# Patient Record
Sex: Female | Born: 1970 | Race: White | Hispanic: No | Marital: Single | Smoking: Never smoker
Health system: Southern US, Community
[De-identification: ages and names within clinical notes are randomized; demographics above are authoritative.]

## PROBLEM LIST (undated history)

## (undated) DIAGNOSIS — G43909 Migraine, unspecified, not intractable, without status migrainosus: Secondary | ICD-10-CM

## (undated) DIAGNOSIS — F32A Depression, unspecified: Secondary | ICD-10-CM

## (undated) DIAGNOSIS — G8929 Other chronic pain: Secondary | ICD-10-CM

## (undated) DIAGNOSIS — M549 Dorsalgia, unspecified: Secondary | ICD-10-CM

## (undated) DIAGNOSIS — K089 Disorder of teeth and supporting structures, unspecified: Secondary | ICD-10-CM

## (undated) DIAGNOSIS — S3992XA Unspecified injury of lower back, initial encounter: Secondary | ICD-10-CM

## (undated) DIAGNOSIS — F329 Major depressive disorder, single episode, unspecified: Secondary | ICD-10-CM

## (undated) DIAGNOSIS — F419 Anxiety disorder, unspecified: Secondary | ICD-10-CM

## (undated) HISTORY — PX: CHOLECYSTECTOMY: SHX55

## (undated) HISTORY — PX: TUBAL LIGATION: SHX77

## (undated) HISTORY — PX: EYE SURGERY: SHX253

---

## 2001-01-02 ENCOUNTER — Emergency Department (HOSPITAL_COMMUNITY): Admission: EM | Admit: 2001-01-02 | Discharge: 2001-01-02 | Payer: Self-pay | Admitting: *Deleted

## 2001-01-04 ENCOUNTER — Inpatient Hospital Stay (HOSPITAL_COMMUNITY): Admission: EM | Admit: 2001-01-04 | Discharge: 2001-01-09 | Payer: Self-pay | Admitting: Psychiatry

## 2003-01-09 ENCOUNTER — Emergency Department (HOSPITAL_COMMUNITY): Admission: EM | Admit: 2003-01-09 | Discharge: 2003-01-09 | Payer: Self-pay | Admitting: Emergency Medicine

## 2003-01-29 ENCOUNTER — Ambulatory Visit (HOSPITAL_COMMUNITY): Admission: AD | Admit: 2003-01-29 | Discharge: 2003-01-29 | Payer: Self-pay | Admitting: Emergency Medicine

## 2003-02-01 ENCOUNTER — Emergency Department (HOSPITAL_COMMUNITY): Admission: EM | Admit: 2003-02-01 | Discharge: 2003-02-01 | Payer: Self-pay | Admitting: Emergency Medicine

## 2003-02-04 ENCOUNTER — Encounter: Payer: Self-pay | Admitting: *Deleted

## 2003-02-04 ENCOUNTER — Emergency Department (HOSPITAL_COMMUNITY): Admission: EM | Admit: 2003-02-04 | Discharge: 2003-02-04 | Payer: Self-pay | Admitting: Emergency Medicine

## 2003-07-14 ENCOUNTER — Emergency Department (HOSPITAL_COMMUNITY): Admission: EM | Admit: 2003-07-14 | Discharge: 2003-07-14 | Payer: Self-pay | Admitting: Emergency Medicine

## 2003-10-14 ENCOUNTER — Emergency Department (HOSPITAL_COMMUNITY): Admission: EM | Admit: 2003-10-14 | Discharge: 2003-10-14 | Payer: Self-pay | Admitting: Emergency Medicine

## 2004-03-06 ENCOUNTER — Emergency Department (HOSPITAL_COMMUNITY): Admission: EM | Admit: 2004-03-06 | Discharge: 2004-03-06 | Payer: Self-pay | Admitting: Emergency Medicine

## 2004-07-07 ENCOUNTER — Emergency Department (HOSPITAL_COMMUNITY): Admission: EM | Admit: 2004-07-07 | Discharge: 2004-07-07 | Payer: Self-pay | Admitting: Emergency Medicine

## 2004-07-20 ENCOUNTER — Emergency Department (HOSPITAL_COMMUNITY): Admission: EM | Admit: 2004-07-20 | Discharge: 2004-07-20 | Payer: Self-pay | Admitting: Emergency Medicine

## 2004-10-18 ENCOUNTER — Emergency Department (HOSPITAL_COMMUNITY): Admission: EM | Admit: 2004-10-18 | Discharge: 2004-10-18 | Payer: Self-pay | Admitting: Emergency Medicine

## 2004-12-11 ENCOUNTER — Emergency Department (HOSPITAL_COMMUNITY): Admission: EM | Admit: 2004-12-11 | Discharge: 2004-12-11 | Payer: Self-pay | Admitting: Emergency Medicine

## 2005-01-30 ENCOUNTER — Emergency Department (HOSPITAL_COMMUNITY): Admission: EM | Admit: 2005-01-30 | Discharge: 2005-01-30 | Payer: Self-pay | Admitting: *Deleted

## 2005-02-14 ENCOUNTER — Emergency Department (HOSPITAL_COMMUNITY): Admission: EM | Admit: 2005-02-14 | Discharge: 2005-02-14 | Payer: Self-pay | Admitting: Emergency Medicine

## 2005-02-21 ENCOUNTER — Emergency Department (HOSPITAL_COMMUNITY): Admission: EM | Admit: 2005-02-21 | Discharge: 2005-02-21 | Payer: Self-pay | Admitting: Emergency Medicine

## 2005-04-18 ENCOUNTER — Emergency Department (HOSPITAL_COMMUNITY): Admission: EM | Admit: 2005-04-18 | Discharge: 2005-04-18 | Payer: Self-pay | Admitting: *Deleted

## 2005-04-30 ENCOUNTER — Emergency Department (HOSPITAL_COMMUNITY): Admission: EM | Admit: 2005-04-30 | Discharge: 2005-04-30 | Payer: Self-pay | Admitting: Emergency Medicine

## 2005-05-25 ENCOUNTER — Emergency Department (HOSPITAL_COMMUNITY): Admission: EM | Admit: 2005-05-25 | Discharge: 2005-05-25 | Payer: Self-pay | Admitting: Emergency Medicine

## 2005-08-30 ENCOUNTER — Emergency Department (HOSPITAL_COMMUNITY): Admission: EM | Admit: 2005-08-30 | Discharge: 2005-08-30 | Payer: Self-pay | Admitting: Emergency Medicine

## 2005-12-19 ENCOUNTER — Emergency Department (HOSPITAL_COMMUNITY): Admission: EM | Admit: 2005-12-19 | Discharge: 2005-12-19 | Payer: Self-pay | Admitting: Emergency Medicine

## 2006-01-07 ENCOUNTER — Emergency Department (HOSPITAL_COMMUNITY): Admission: EM | Admit: 2006-01-07 | Discharge: 2006-01-07 | Payer: Self-pay | Admitting: Emergency Medicine

## 2006-09-01 ENCOUNTER — Emergency Department (HOSPITAL_COMMUNITY): Admission: EM | Admit: 2006-09-01 | Discharge: 2006-09-01 | Payer: Self-pay | Admitting: Emergency Medicine

## 2007-11-16 ENCOUNTER — Emergency Department (HOSPITAL_COMMUNITY): Admission: EM | Admit: 2007-11-16 | Discharge: 2007-11-16 | Payer: Self-pay | Admitting: Emergency Medicine

## 2007-11-17 ENCOUNTER — Emergency Department (HOSPITAL_COMMUNITY): Admission: EM | Admit: 2007-11-17 | Discharge: 2007-11-17 | Payer: Self-pay | Admitting: Emergency Medicine

## 2008-04-12 ENCOUNTER — Emergency Department (HOSPITAL_COMMUNITY): Admission: EM | Admit: 2008-04-12 | Discharge: 2008-04-13 | Payer: Self-pay | Admitting: Emergency Medicine

## 2008-04-15 ENCOUNTER — Observation Stay (HOSPITAL_COMMUNITY): Admission: EM | Admit: 2008-04-15 | Discharge: 2008-04-16 | Payer: Self-pay | Admitting: Emergency Medicine

## 2008-06-27 ENCOUNTER — Emergency Department (HOSPITAL_COMMUNITY): Admission: EM | Admit: 2008-06-27 | Discharge: 2008-06-27 | Payer: Self-pay | Admitting: Emergency Medicine

## 2008-07-17 ENCOUNTER — Emergency Department (HOSPITAL_COMMUNITY): Admission: EM | Admit: 2008-07-17 | Discharge: 2008-07-17 | Payer: Self-pay | Admitting: Emergency Medicine

## 2008-07-22 ENCOUNTER — Emergency Department (HOSPITAL_COMMUNITY): Admission: EM | Admit: 2008-07-22 | Discharge: 2008-07-22 | Payer: Self-pay | Admitting: Emergency Medicine

## 2008-09-30 ENCOUNTER — Emergency Department (HOSPITAL_COMMUNITY): Admission: EM | Admit: 2008-09-30 | Discharge: 2008-09-30 | Payer: Self-pay | Admitting: Emergency Medicine

## 2009-02-08 ENCOUNTER — Emergency Department (HOSPITAL_COMMUNITY): Admission: EM | Admit: 2009-02-08 | Discharge: 2009-02-08 | Payer: Self-pay | Admitting: Emergency Medicine

## 2009-05-11 ENCOUNTER — Emergency Department (HOSPITAL_COMMUNITY): Admission: EM | Admit: 2009-05-11 | Discharge: 2009-05-11 | Payer: Self-pay | Admitting: Emergency Medicine

## 2009-05-18 ENCOUNTER — Emergency Department (HOSPITAL_COMMUNITY): Admission: EM | Admit: 2009-05-18 | Discharge: 2009-05-18 | Payer: Self-pay | Admitting: Emergency Medicine

## 2009-08-12 ENCOUNTER — Emergency Department (HOSPITAL_COMMUNITY): Admission: EM | Admit: 2009-08-12 | Discharge: 2009-08-12 | Payer: Self-pay | Admitting: Emergency Medicine

## 2009-09-20 ENCOUNTER — Emergency Department (HOSPITAL_COMMUNITY): Admission: EM | Admit: 2009-09-20 | Discharge: 2009-09-20 | Payer: Self-pay | Admitting: Emergency Medicine

## 2009-11-09 ENCOUNTER — Emergency Department (HOSPITAL_COMMUNITY): Admission: EM | Admit: 2009-11-09 | Discharge: 2009-11-09 | Payer: Self-pay | Admitting: Emergency Medicine

## 2010-05-31 ENCOUNTER — Emergency Department (HOSPITAL_COMMUNITY): Admission: EM | Admit: 2010-05-31 | Discharge: 2010-05-31 | Payer: Self-pay | Admitting: Emergency Medicine

## 2010-07-02 ENCOUNTER — Emergency Department (HOSPITAL_COMMUNITY): Admission: EM | Admit: 2010-07-02 | Discharge: 2010-07-02 | Payer: Self-pay | Admitting: Emergency Medicine

## 2010-09-25 ENCOUNTER — Emergency Department (HOSPITAL_COMMUNITY)
Admission: EM | Admit: 2010-09-25 | Discharge: 2010-09-25 | Payer: Self-pay | Source: Home / Self Care | Admitting: Emergency Medicine

## 2010-10-13 ENCOUNTER — Emergency Department (HOSPITAL_COMMUNITY)
Admission: EM | Admit: 2010-10-13 | Discharge: 2010-10-13 | Disposition: A | Discharge status: Home / Self Care | Payer: Self-pay | Attending: Emergency Medicine | Admitting: Emergency Medicine

## 2010-10-13 DIAGNOSIS — G8929 Other chronic pain: Secondary | ICD-10-CM | POA: Insufficient documentation

## 2010-10-13 DIAGNOSIS — M545 Low back pain, unspecified: Secondary | ICD-10-CM | POA: Insufficient documentation

## 2011-01-01 ENCOUNTER — Emergency Department (HOSPITAL_COMMUNITY): Payer: Self-pay

## 2011-01-01 ENCOUNTER — Emergency Department (HOSPITAL_COMMUNITY)
Admission: EM | Admit: 2011-01-01 | Discharge: 2011-01-01 | Disposition: A | Discharge status: Home / Self Care | Payer: Self-pay | Attending: Emergency Medicine | Admitting: Emergency Medicine

## 2011-01-01 DIAGNOSIS — S40019A Contusion of unspecified shoulder, initial encounter: Secondary | ICD-10-CM | POA: Insufficient documentation

## 2011-01-01 DIAGNOSIS — W19XXXA Unspecified fall, initial encounter: Secondary | ICD-10-CM | POA: Insufficient documentation

## 2011-01-01 DIAGNOSIS — S7000XA Contusion of unspecified hip, initial encounter: Secondary | ICD-10-CM | POA: Insufficient documentation

## 2011-01-01 DIAGNOSIS — Z79899 Other long term (current) drug therapy: Secondary | ICD-10-CM | POA: Insufficient documentation

## 2011-01-01 DIAGNOSIS — M25559 Pain in unspecified hip: Secondary | ICD-10-CM | POA: Insufficient documentation

## 2011-01-01 DIAGNOSIS — M25519 Pain in unspecified shoulder: Secondary | ICD-10-CM | POA: Insufficient documentation

## 2011-01-24 NOTE — Discharge Summary (Signed)
Pamela May, Pamela May                ACCOUNT NO.:  [id number]   MEDICAL RECORD NO.:  [id number]          PATIENT TYPE:  OBV   LOCATION:  A325                          FACILITY:  [facility]   PHYSICIAN:  Skeet Latch, DO    DATE OF BIRTH:  [birth year]   DATE OF ADMISSION:  04/15/2008  DATE OF DISCHARGE:  08/06/2009LH                               DISCHARGE SUMMARY   DISCHARGE DIAGNOSES:  1. Drug overdose, questionable intentional.  2. History of bipolar disorder.  3. History of depression.  4. History of migraines.   BRIEF HOSPITAL COURSE:  This is a 40 year old female who presented to  the emergency room with possible drug overdose.  When she came to the  emergency room, she had altered mental status, unable to get accurate  history.  Apparently, the patient was seen at the emergency room several  days prior for a kick by a horse and was treated with pain medications.  On the day of admission, an anonymous phone call was made that said she  may have overdosed, was unsure if it was intentional or unintentional.  She was brought by EMS.  On initial exam, the patient cannot explain  exactly how many pills she took, but her drug screen was positive for  benzodiazepines and opiates.  The patient was seen by the ACT team in  the emergency room, did contract for safety.  ER doctor was planning to  discharge the patient, but the patient was still somnolent.  Today, she  is more responsive.  ACT team again saw the patient, felt that the  patient has no current suicidal ideation and no suicide plan.  At this  time, they felt the patient could be discharged since she has signed a  contract for safety.  I was called to the floor.  The patient was  threatening to leave AMA, so at this time we will let the patient be  discharged.   MEDICATIONS ON DISCHARGE:  1. Seroquel 100 mg at bedtime.  2. Lexapro 10 mg daily.  3. Hydrocodone/acetaminophen 5/500 every 6 hours as needed.   VITALS ON DISCHARGE:   Temperature is 97.9, pulse 79, respirations 20,  and blood pressure 94/59.  She is satting 98% on room air.   LABORATORIES:  Sodium 140, potassium 3.4, chloride 111, CO2 of 27,  glucose 85, BUN 5, creatinine 0.74, white count 3.1, hemoglobin 11.4,  hematocrit 33.1, platelet count is 129.  As stated, her salicylate level  is less than 4, alcohol level is less than 5, and acetaminophen level  was less than 10.   CONDITION ON DISCHARGE:  Stable.   DISPOSITION:  The patient will be discharged to home.   DISCHARGE INSTRUCTIONS:  The patient is to maintain a regular diet.  She  is to increase her activity to previous.  The patient again denies any  intentional drug overdose at this time.  The patient has agreed to  [facility] and has spoken with the ACT team regarding  this.      Skeet Latch, DO  Electronically Signed  SM/MEDQ  D:  04/16/2008  T:  04/17/2008  Job:  [id number]

## 2011-01-24 NOTE — H&P (Signed)
Pamela May, Pamela May NO.:  [id number]   MEDICAL RECORD NO.:  [id number]          PATIENT TYPE:  OBV   LOCATION:  A325                          FACILITY:  [facility]   PHYSICIAN:  Dorris Singh, DO    DATE OF BIRTH:  [birth year]   DATE OF ADMISSION:  04/15/2008  DATE OF DISCHARGE:  LH                              HISTORY & PHYSICAL   The patient is a 40 year old Caucasian female who presented to [facility] emergency room with possible overdose.  A level of caveat applied  to her because of altered mental status.  Apparently, she has had a  history of being seen in the ED several days before for a kick by a  horse in which she was treated with pain medication, and today  apparently an anonymous phone call was made that the patient may have  overdosed, not sure if it was intentional or unintentional.  She was  then brought here via EMS.  This call apparently was made by her mother.  While she was here, she continued to improve, but could not give an  exact date of how many pills that she did take.  Her drug screen was  positive for benzodiazepines and opiates.  The ACT team did see her  while she was in the emergency room.  She did contract for safety.  Plan  of the ER doctor was to discharge her to home because she was improving.  Prior to discharge, she became more somnolent and more unresponsive.  However, she was able to protect her airway.  It was determined that she  should be at least observed for 24 hours.  We went ahead and admitted  the patient to service of [facility].  Also there is some concern from  the mother that the patient may have had a breakup with her boyfriend,  and this may have prompted the use of these medications.  She is  hypokalemic.   PAST MEDICAL HISTORY:  Obtained from her chart.  She is bipolar,  depression and migraine headache.   SOCIAL HISTORY:  Unknown, unable to obtain.   PAST SURGICAL HISTORY:  Bilateral tubal ligation.   MEDICATIONS:  1. Seroquel 100 mg.  2. Lexapro 10 mg.  3. Xanax 1 mg.  4. Hydrocodone/acetaminophen 5/500.   REVIEW OF SYSTEMS:  A caveat of 5, unable to assess.   PHYSICAL EXAMINATION:  VITALS:  Temperature 97.8, pulse 78, respirations  20, blood pressure 112/67.  GENERAL:  The patient is a 40 year old Caucasian female who is  unresponsive to questions and when she does speak they do not make  sense.  HEENT:  Her head is normocephalic, atraumatic.  Eyes; pupils are  dilated.  NECK:  Supple.  No lymphadenopathy noted.  HEART:  Regular rate and rhythm.  LUNGS: Clear to auscultation bilaterally.  ABDOMEN: Soft, nontender, nondistended.  EXTREMITIES:  Positive pulses.  No edema, ecchymosis or cyanosis.  On  her left upper armpit she does have an abscess which is tender to the  touch.  Unable to assess if the patient wants anything done  prior to  admission to the service of [facility].  The patient told the nurse that  she did want anything done with this at that point in time, but we will  reassess this when the patient is more alert.   LABORATORY DATA:  Today, white count of 3.5, hemoglobin 11.7, hematocrit  33.7, platelet count 147.  Sodium is 136, potassium 3.2, chloride 106,  CO2 of 26, glucose 89, BUN 70, creatinine 0.71.  There was no other  testing that was done.   ASSESSMENT/PLAN:  Admits this is an unintentional overdose and history  of contusion to leg after injury from horse 2 days prior.   PLAN:  Admit to the service of [facility].  Will go ahead and administer  fluids.  I gave the patient Narcan, which did not wake her up.  Will put  her on DVT and GI prophylaxis.  As soon as she is more, we will have her  increase her diet as tolerated.  Will also request to have the ACT team  see here for a second time that she has dove back into being  unresponsive.  Also will give her antiemetics and Tylenol.  Will put her  on O2 as well and check her labs in the morning.  We will  also replace  her potassium.  Will continue to monitor her and hope we can discharge  her within the next 24-48 hours.      Dorris Singh, DO  Electronically Signed     CB/MEDQ  D:  04/16/2008  T:  04/16/2008  Job:  [id number]

## 2011-01-27 NOTE — Discharge Summary (Signed)
[facility]  Patient:    Pamela May, Pamela May                         MRN: [id number] Adm. Date:  [id number] Disc. Date: [id number] Attending:  Veneta Penton                           Discharge Summary  REASON FOR ADMISSION:  This 40 year old single white female was admitted for increasing symptoms of depression and suicidal ideation with a plan to kill herself by drug overdose.  For further history of present illness, please see the patients psychiatric admission assessment.  PHYSICAL EXAMINATION:  The patients physical examination at the time of admission was entirely unremarkable.  LABORATORY EXAMINATION:  The patient underwent a laboratory work-up to rule out any medical problems contributing to her symptomatology.  A comprehensive metabolic panel was within normal limits.  Hepatic panel was within normal limits. CBC was within normal limits. Thyroid function tests were within normal limits.  UA showed small leukocyte esterase, dipstick and urine microscopic evaluation was unremarkable.  The patient complained of no urinary tract symptoms.  The patient received no x-rays, no special procedures, no additional consultations. The patient sustained no complications during the course of this hospitalization.  HOSPITAL COURSE:  The patient rapidly adapted to unit routine, socializing well with both patients and staff. She was increased on Zoloft 150 mg p.o. q.d. as her admission dose of 100 mg was ineffectual. She complained of some psychomotor agitation and was placed on Seroquel at 25 mg p.o. b.i.d. which greatly improved her anxiety as well as her agitation.  She has also continued on Ambien 10 mg p.o. q.h.s. on a p.r.n. basis for sleep. She has been active and participating in all aspects of the therapeutic treatment program.  Her affect and mood have improved as have her activities of daily living, and at the time of discharge, she has established a  treatment plan for herself to address her psychosocial stressors on discharge.  At the time of discharge she denies any homicidal or suicidal ideations, her affect and mood have improved, she is performing all her activities of daily living.  Consequently, it is felt she has reached her maximum benefits of hospitalization and is ready for discharge to a less restrictive alternative setting.  She is motivated for outpatient therapy.  CONDITION ON DISCHARGE:  Improved.  DIAGNOSIS ACCORDING TO DSM-4: AXIS I.   1. Major depression, recurrent type, severe without psychosis.           2. Rule out post traumatic stress disorder. AXIS II.  Rule out personality disorder, not otherwise specified. AXIS III. None. AXIS IV.  Current psychosocial stressors are extreme with problems with           primary support group, social environment, educational problems,           occupational problems, housing problems, economic problems and           problems with access to heathcare as well as problems related to           the legal system. AXIS V.   Code 10 go 20 on admission, code 30 on discharge, highest level of           functioning in the past code 60.  FURTHER EVALUATION AND TREATMENT RECOMMENDATIONS: 1. The patient is discharged to home. 2. She will follow up at  [facility] for all    further aspects of her psychiatric care and consequently I will sign off    on the case at this time. 3. She is discharged on an unrestricted level of activity and a regular diet. 4. She is discharged on Seroquel 25 mg p.o. b.i.d., Zoloft 150 mg p.o. q.d.,    Ambien 10 mg p.o. q.h.s. p.r.n. sleep. DD:  01/09/01 TD:  01/10/01 Job: [id number]
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
## 2011-02-06 ENCOUNTER — Emergency Department (HOSPITAL_COMMUNITY)
Admission: EM | Admit: 2011-02-06 | Discharge: 2011-02-06 | Disposition: A | Discharge status: Home / Self Care | Payer: Self-pay | Attending: Emergency Medicine | Admitting: Emergency Medicine

## 2011-02-06 ENCOUNTER — Emergency Department (HOSPITAL_COMMUNITY): Payer: Self-pay

## 2011-02-06 DIAGNOSIS — R0989 Other specified symptoms and signs involving the circulatory and respiratory systems: Secondary | ICD-10-CM | POA: Insufficient documentation

## 2011-02-06 DIAGNOSIS — R21 Rash and other nonspecific skin eruption: Secondary | ICD-10-CM | POA: Insufficient documentation

## 2011-02-06 DIAGNOSIS — F341 Dysthymic disorder: Secondary | ICD-10-CM | POA: Insufficient documentation

## 2011-02-06 DIAGNOSIS — R0609 Other forms of dyspnea: Secondary | ICD-10-CM | POA: Insufficient documentation

## 2011-02-06 DIAGNOSIS — R0602 Shortness of breath: Secondary | ICD-10-CM | POA: Insufficient documentation

## 2011-02-06 DIAGNOSIS — I10 Essential (primary) hypertension: Secondary | ICD-10-CM | POA: Insufficient documentation

## 2011-02-06 DIAGNOSIS — T7840XA Allergy, unspecified, initial encounter: Secondary | ICD-10-CM | POA: Insufficient documentation

## 2011-06-05 LAB — POCT CARDIAC MARKERS
Myoglobin, poc: 39.7
Troponin i, poc: <0.05

## 2011-06-05 LAB — CBC
HCT: 39.9
MCV: 88.5
RBC: 4.51
WBC: 7.4

## 2011-06-05 LAB — URINALYSIS, ROUTINE W REFLEX MICROSCOPIC
Glucose, UA: NEGATIVE
Ketones, ur: NEGATIVE
Nitrite: POSITIVE — AB
Protein, ur: NEGATIVE
Urobilinogen, UA: 0.2
pH: 6

## 2011-06-05 LAB — DIFFERENTIAL
Eosinophils Absolute: 0
Eosinophils Relative: 0
Lymphocytes Relative: 25
Lymphs Abs: 1.8
Monocytes Relative: 5
Neutrophils Relative %: 70

## 2011-06-05 LAB — BASIC METABOLIC PANEL
Chloride: 104
GFR calc Af Amer: >60
Potassium: 3.7

## 2011-06-05 LAB — URINE MICROSCOPIC-ADD ON

## 2011-06-09 LAB — BASIC METABOLIC PANEL
Calcium: 7.8 — ABNORMAL LOW
Creatinine, Ser: 0.74
GFR calc non Af Amer: >60
Glucose, Bld: 85
Sodium: 140

## 2011-06-09 LAB — DIFFERENTIAL
Basophils Absolute: 0
Basophils Absolute: 0.1
Basophils Relative: 2 — ABNORMAL HIGH
Basophils Relative: 2 — ABNORMAL HIGH
Eosinophils Absolute: 0
Lymphocytes Relative: 36
Monocytes Absolute: 0.4
Monocytes Absolute: 0.4
Neutro Abs: 1.4 — ABNORMAL LOW
Neutro Abs: 2.1
Neutrophils Relative %: 47
Neutrophils Relative %: 60

## 2011-06-09 LAB — URINE MICROSCOPIC-ADD ON

## 2011-06-09 LAB — URINALYSIS, ROUTINE W REFLEX MICROSCOPIC
Glucose, UA: NEGATIVE
Ketones, ur: NEGATIVE
Leukocytes, UA: NEGATIVE
pH: 6

## 2011-06-09 LAB — CBC
HCT: 33.7 — ABNORMAL LOW
Hemoglobin: 11.4 — ABNORMAL LOW
Hemoglobin: 11.7 — ABNORMAL LOW
Platelets: 129 — ABNORMAL LOW
RDW: 13.2
WBC: 3.5 — ABNORMAL LOW

## 2011-06-09 LAB — COMPREHENSIVE METABOLIC PANEL
Albumin: 3.6
Alkaline Phosphatase: 76
BUN: 7
Chloride: 106
Glucose, Bld: 89
Potassium: 3.2 — ABNORMAL LOW
Total Bilirubin: 0.6

## 2011-06-09 LAB — ETHANOL: Alcohol, Ethyl (B): <5

## 2011-06-09 LAB — RAPID URINE DRUG SCREEN, HOSP PERFORMED: Barbiturates: NOT DETECTED

## 2011-09-27 ENCOUNTER — Emergency Department (HOSPITAL_COMMUNITY)
Admission: EM | Admit: 2011-09-27 | Discharge: 2011-09-27 | Disposition: A | Discharge status: Home / Self Care | Payer: Self-pay | Attending: Emergency Medicine | Admitting: Emergency Medicine

## 2011-09-27 ENCOUNTER — Encounter (HOSPITAL_COMMUNITY): Payer: Self-pay

## 2011-09-27 DIAGNOSIS — R6884 Jaw pain: Secondary | ICD-10-CM | POA: Insufficient documentation

## 2011-09-27 DIAGNOSIS — K089 Disorder of teeth and supporting structures, unspecified: Secondary | ICD-10-CM | POA: Insufficient documentation

## 2011-09-27 DIAGNOSIS — R220 Localized swelling, mass and lump, head: Secondary | ICD-10-CM | POA: Insufficient documentation

## 2011-09-27 DIAGNOSIS — K047 Periapical abscess without sinus: Secondary | ICD-10-CM | POA: Insufficient documentation

## 2011-09-27 MED ORDER — AMOXICILLIN 250 MG PO CAPS
500.0000 mg | ORAL_CAPSULE | Freq: Once | ORAL | Status: AC
  Administered 2011-09-27: 500 mg via ORAL
  Filled 2011-09-27: qty 2

## 2011-09-27 MED ORDER — HYDROCODONE-ACETAMINOPHEN 5-325 MG PO TABS: 1.0000 | ORAL_TABLET | Freq: Once | ORAL | Status: AC

## 2011-09-27 MED ORDER — AMOXICILLIN 500 MG PO CAPS: 500.0000 mg | ORAL_CAPSULE | Freq: Three times a day (TID) | ORAL | Status: AC

## 2011-09-27 MED ORDER — HYDROCODONE-ACETAMINOPHEN 5-325 MG PO TABS
1.0000 | ORAL_TABLET | Freq: Once | ORAL | Status: AC
  Administered 2011-09-27: 1 via ORAL
  Filled 2011-09-27: qty 1

## 2011-09-27 NOTE — ED Notes (Signed)
Pt has toothache, pt has a multiple dental caries and missing teeth. Noted swelling in left side of face from jaw line to upper eye lid. Pt denies n/v and fever.

## 2011-09-27 NOTE — ED Notes (Signed)
Several teeth broken off to the gums, having dental pain per pt. Also woke up this morning with swelling under left eye per pt.

## 2011-09-27 NOTE — ED Provider Notes (Signed)
Medical screening examination/treatment/procedure(s) were performed by non-physician practitioner and as supervising physician I was immediately available for consultation/collaboration.   Glynn Octave, MD 09/27/11 2121

## 2011-09-27 NOTE — ED Provider Notes (Signed)
History     CSN: [id number]  Arrival date & time 09/27/11  4098   First MD Initiated Contact with Patient 09/27/11 1933      Chief Complaint  Patient presents with  . Dental Pain    (Consider location/radiation/quality/duration/timing/severity/associated sxs/prior treatment) Patient is a 41 y.o. female presenting with tooth pain. The history is provided by the patient.  Dental PainThe primary symptoms include mouth pain. Primary symptoms do not include headaches, fever, shortness of breath or sore throat. The symptoms began 12 to 24 hours ago. The symptoms are worsening. The symptoms occur constantly.  Additional symptoms include: dental sensitivity to temperature, gum swelling, gum tenderness, jaw pain and facial swelling. Additional symptoms do not include: trouble swallowing, ear pain and swollen glands.    History reviewed. No pertinent past medical history.  Past Surgical History  Procedure Date  . Tubal ligation   . Eye surgery     No family history on file.  History  Substance Use Topics  . Smoking status: Never Smoker   . Smokeless tobacco: Not on file  . Alcohol Use: No    OB History    Grav Para Term Preterm Abortions TAB SAB Ect Mult Living                  Review of Systems  Constitutional: Negative for fever.  HENT: Positive for facial swelling and dental problem. Negative for ear pain, congestion, sore throat, trouble swallowing and neck pain.   Eyes: Negative.   Respiratory: Negative for chest tightness and shortness of breath.   Cardiovascular: Negative for chest pain.  Gastrointestinal: Negative for nausea and abdominal pain.  Genitourinary: Negative.   Musculoskeletal: Negative for joint swelling and arthralgias.  Skin: Negative.  Negative for rash and wound.  Neurological: Negative for dizziness, weakness, light-headedness, numbness and headaches.  Hematological: Negative.   Psychiatric/Behavioral: Negative.     Allergies  Sulfa  antibiotics and Toradol  Home Medications   Current Outpatient Rx  Name Route Sig Dispense Refill  . ALPRAZOLAM 1 MG PO TABS Oral Take 1 mg by mouth 3 (three) times daily as needed. For anxiety    . CITALOPRAM HYDROBROMIDE 20 MG PO TABS Oral Take 20 mg by mouth every morning.     Marland Kitchen QUETIAPINE FUMARATE 200 MG PO TABS Oral Take 200 mg by mouth at bedtime.    Marland Kitchen QUETIAPINE FUMARATE 50 MG PO TABS Oral Take 100 mg by mouth every evening.     Marland Kitchen AMOXICILLIN 500 MG PO CAPS Oral Take 1 capsule (500 mg total) by mouth 3 (three) times daily. 30 capsule 0  . HYDROCODONE-ACETAMINOPHEN 5-325 MG PO TABS Oral Take 1 tablet by mouth once. 15 tablet 0    BP 111/67  Pulse 77  Temp(Src) 98.4 F (36.9 C) (Oral)  Resp 16  Ht 5\' 7"  (1.702 m)  Wt 195 lb (88.451 kg)  BMI 30.54 kg/m2  SpO2 100%  LMP 09/07/2011  Physical Exam  Constitutional: She is oriented to person, place, and time. She appears well-developed and well-nourished. No distress.  HENT:  Head: Normocephalic and atraumatic.    Right Ear: Tympanic membrane and external ear normal.  Left Ear: Tympanic membrane and external ear normal.  Mouth/Throat: Oropharynx is clear and moist and mucous membranes are normal. No oral lesions. Dental abscesses present.    Eyes: Conjunctivae are normal.  Neck: Normal range of motion. Neck supple.  Cardiovascular: Normal rate and normal heart sounds.   Pulmonary/Chest: Effort normal.  Abdominal: She exhibits no distension.  Musculoskeletal: Normal range of motion.  Lymphadenopathy:    She has no cervical adenopathy.  Neurological: She is alert and oriented to person, place, and time.  Skin: Skin is warm and dry. No erythema.  Psychiatric: She has a normal mood and affect.    ED Course  Procedures (including critical care time)  Labs Reviewed - No data to display No results found.   1. Dental abscess       MDM  Amoxicillin 500 mg 3 times a day for 10 days. Hydrocodone #15. Dental referral  list given.        Candis Musa, PA 09/27/11 2035

## 2011-12-22 ENCOUNTER — Emergency Department (HOSPITAL_COMMUNITY)
Admission: EM | Admit: 2011-12-22 | Discharge: 2011-12-22 | Disposition: A | Discharge status: Home / Self Care | Payer: Self-pay | Attending: Emergency Medicine | Admitting: Emergency Medicine

## 2011-12-22 ENCOUNTER — Encounter (HOSPITAL_COMMUNITY): Payer: Self-pay | Admitting: Emergency Medicine

## 2011-12-22 DIAGNOSIS — K0889 Other specified disorders of teeth and supporting structures: Secondary | ICD-10-CM

## 2011-12-22 DIAGNOSIS — R51 Headache: Secondary | ICD-10-CM | POA: Insufficient documentation

## 2011-12-22 DIAGNOSIS — H05229 Edema of unspecified orbit: Secondary | ICD-10-CM | POA: Insufficient documentation

## 2011-12-22 DIAGNOSIS — F341 Dysthymic disorder: Secondary | ICD-10-CM | POA: Insufficient documentation

## 2011-12-22 DIAGNOSIS — H9209 Otalgia, unspecified ear: Secondary | ICD-10-CM | POA: Insufficient documentation

## 2011-12-22 DIAGNOSIS — K089 Disorder of teeth and supporting structures, unspecified: Secondary | ICD-10-CM | POA: Insufficient documentation

## 2011-12-22 HISTORY — DX: Depression, unspecified: F32.A

## 2011-12-22 HISTORY — DX: Major depressive disorder, single episode, unspecified: F32.9

## 2011-12-22 HISTORY — DX: Anxiety disorder, unspecified: F41.9

## 2011-12-22 MED ORDER — AMOXICILLIN 250 MG PO CAPS: 250.0000 mg | ORAL_CAPSULE | Freq: Three times a day (TID) | ORAL | Status: AC

## 2011-12-22 MED ORDER — OXYCODONE-ACETAMINOPHEN 5-325 MG PO TABS: 1.0000 | ORAL_TABLET | ORAL | Status: AC | PRN

## 2011-12-22 MED ORDER — DIPHENHYDRAMINE HCL 25 MG PO CAPS
50.0000 mg | ORAL_CAPSULE | Freq: Once | ORAL | Status: AC
  Administered 2011-12-22: 50 mg via ORAL
  Filled 2011-12-22: qty 2

## 2011-12-22 MED ORDER — OXYCODONE-ACETAMINOPHEN 5-325 MG PO TABS
1.0000 | ORAL_TABLET | Freq: Once | ORAL | Status: AC
  Administered 2011-12-22: 1 via ORAL
  Filled 2011-12-22: qty 1

## 2011-12-22 MED ORDER — TETRACAINE HCL 0.5 % OP SOLN
OPHTHALMIC | Status: AC
  Filled 2011-12-22: qty 2

## 2011-12-22 MED ORDER — TETRACAINE HCL 0.5 % OP SOLN
1.0000 [drp] | Freq: Once | OPHTHALMIC | Status: AC
Start: 2011-12-22 — End: 2011-12-22
  Administered 2011-12-22: 1 [drp] via OPHTHALMIC

## 2011-12-22 NOTE — ED Notes (Signed)
Pt complaining of knots on side of ears, headache and left eye swollen.

## 2011-12-22 NOTE — ED Provider Notes (Signed)
Medical screening examination/treatment/procedure(s) were performed by non-physician practitioner and as supervising physician I was immediately available for consultation/collaboration.   Nysir Fergusson M Dickie Labarre, DO 12/22/11 1929 

## 2011-12-22 NOTE — ED Notes (Signed)
Pt presents with left periorbital swelling, left earache and headache that began on Tuesday pt has self medicated with OTC without relief.

## 2011-12-22 NOTE — Discharge Instructions (Signed)
WE ARE NOT SURE IF THE SWELLING AND PAIN IS FROM YOUR DECAYED TOOTH OR FROM ALLERGIC REACTION OR OTHER EYE PROBLEM. TAKE BENADRYL FOR ITCHING AND ALLERGIC REACTION, TAKE THE PAIN MEDICATION AND ANTIBIOTIC AS DIRECTED. CALL DR. HAINES FOR FOLLOW UP FOR AN EYE EXAM. RETURN HERE AS NEEDED.   Dental Pain A tooth ache may be caused by cavities (tooth decay). Cavities expose the nerve of the tooth to air and hot or cold temperatures. It may come from an infection or abscess (also called a boil or furuncle) around your tooth. It is also often caused by dental caries (tooth decay). This causes the pain you are having. DIAGNOSIS  Your caregiver can diagnose this problem by exam. TREATMENT   If caused by an infection, it may be treated with medications which kill germs (antibiotics) and pain medications as prescribed by your caregiver. Take medications as directed.   Only take over-the-counter or prescription medicines for pain, discomfort, or fever as directed by your caregiver.   Whether the tooth ache today is caused by infection or dental disease, you should see your dentist as soon as possible for further care.  SEEK MEDICAL CARE IF: The exam and treatment you received today has been provided on an emergency basis only. This is not a substitute for complete medical or dental care. If your problem worsens or new problems (symptoms) appear, and you are unable to meet with your dentist, call or return to this location. SEEK IMMEDIATE MEDICAL CARE IF:   You have a fever.   You develop redness and swelling of your face, jaw, or neck.   You are unable to open your mouth.   You have severe pain uncontrolled by pain medicine.  MAKE SURE YOU:   Understand these instructions.   Will watch your condition.   Will get help right away if you are not doing well or get worse.  Document Released: 08/28/2005 Document Revised: 08/17/2011 Document Reviewed: 04/15/2008 [facility] Patient Information 2012  [place], [place].

## 2011-12-22 NOTE — ED Provider Notes (Signed)
History     CSN: [id number]  Arrival date & time 12/22/11  0936   None     Chief Complaint  Patient presents with  . Eye Pain  . Otalgia  . Headache    (Consider location/radiation/quality/duration/timing/severity/associated sxs/prior treatment) HPI GLEE LASHOMB is a 41 y.o. female who presents to the emergency department for left eye swelling and headache. Onset of swelling to left eye 3 days ago followed by pain soon after. Noted knot on front of ears 2 days ago. Associated symptoms include nausea and pain of temporal area. The pain is rated as severe 10/10. The pain radiates from the eye down to the neck. She describes the pain as sharp that comes and goes. Never had anything like this before. Has a decayed tooth left upper that is painful. Has had chronic dental issues. The history was provided by the patient.  Past Medical History  Diagnosis Date  . Anxiety   . Depression     Past Surgical History  Procedure Date  . Tubal ligation   . Eye surgery     History reviewed. No pertinent family history.  History  Substance Use Topics  . Smoking status: Never Smoker   . Smokeless tobacco: Not on file  . Alcohol Use: No    OB History    Grav Para Term Preterm Abortions TAB SAB Ect Mult Living                  Review of Systems  Constitutional: Positive for chills. Negative for fever, diaphoresis and fatigue.  HENT: Positive for ear pain, facial swelling, neck pain and dental problem. Negative for congestion, sore throat, neck stiffness and sinus pressure.   Eyes: Positive for photophobia, pain and redness. Negative for discharge.  Respiratory: Negative for cough, chest tightness and wheezing.   Cardiovascular: Negative.   Gastrointestinal: Positive for nausea. Negative for vomiting, abdominal pain, diarrhea, constipation and abdominal distention.  Genitourinary: Negative for dysuria, frequency, flank pain, vaginal bleeding, vaginal discharge, difficulty urinating  and menstrual problem.  Musculoskeletal: Negative for myalgias, back pain and gait problem.  Skin: Negative for color change and rash.  Neurological: Positive for light-headedness and headaches. Negative for dizziness, speech difficulty, weakness and numbness.  Psychiatric/Behavioral: Negative for confusion and agitation.       Depression, bipolar    Allergies  Cortisone base; Sulfa antibiotics; Toradol; and Ultram  Home Medications   Current Outpatient Rx  Name Route Sig Dispense Refill  . ALPRAZOLAM 1 MG PO TABS Oral Take 1 mg by mouth 3 (three) times daily as needed. For anxiety    . CITALOPRAM HYDROBROMIDE 20 MG PO TABS Oral Take 20 mg by mouth every morning.     Marland Kitchen QUETIAPINE FUMARATE 200 MG PO TABS Oral Take 200 mg by mouth at bedtime.    Marland Kitchen QUETIAPINE FUMARATE 50 MG PO TABS Oral Take 100 mg by mouth every evening.       BP 125/75  Pulse 81  Temp(Src) 98.2 F (36.8 C) (Oral)  Resp 18  Ht 5\' 7"  (1.702 m)  Wt 200 lb (90.719 kg)  BMI 31.32 kg/m2  SpO2 100%  LMP 12/11/2011  Physical Exam  Nursing note and vitals reviewed. Constitutional: She is oriented to person, place, and time. She appears well-developed and well-nourished. No distress.  HENT:  Right Ear: External ear normal.  Left Ear: External ear normal.  Mouth/Throat: Uvula is midline. Dental caries present.         Edema  left orbit. Tender on palpation. Tender with palpation left upper teeth. Decay noted.  Eyes: EOM are normal. Pupils are equal, round, and reactive to light. Left conjunctiva is injected.    Neck: Neck supple.  Pulmonary/Chest: Effort normal.  Abdominal: Soft. There is no tenderness.  Musculoskeletal: Normal range of motion.  Neurological: She is alert and oriented to person, place, and time. No cranial nerve deficit.  Skin: Skin is warm and dry.  Psychiatric: She has a normal mood and affect. Her behavior is normal. Judgment and thought content normal.   Assessment: left orbital  edema   Dental pain  Diff. Diag. Allergic reaction   Dental abscess   Other opthalmology problem  Plan:  Amoxil Rx   Percocet Rx    Benadryl OTC   Follow up with opthalmology     ED Course: slit lamp exam, no FB identified, no corneal abrasion.    Procedures   MDM          Janne Napoleon, NP 12/22/11 1303

## 2011-12-26 ENCOUNTER — Emergency Department (HOSPITAL_COMMUNITY)
Admission: EM | Admit: 2011-12-26 | Discharge: 2011-12-26 | Disposition: A | Discharge status: Home / Self Care | Payer: Self-pay | Attending: Emergency Medicine | Admitting: Emergency Medicine

## 2011-12-26 ENCOUNTER — Encounter (HOSPITAL_COMMUNITY): Payer: Self-pay

## 2011-12-26 DIAGNOSIS — F329 Major depressive disorder, single episode, unspecified: Secondary | ICD-10-CM | POA: Insufficient documentation

## 2011-12-26 DIAGNOSIS — F411 Generalized anxiety disorder: Secondary | ICD-10-CM | POA: Insufficient documentation

## 2011-12-26 DIAGNOSIS — F3289 Other specified depressive episodes: Secondary | ICD-10-CM | POA: Insufficient documentation

## 2011-12-26 DIAGNOSIS — B029 Zoster without complications: Secondary | ICD-10-CM | POA: Insufficient documentation

## 2011-12-26 MED ORDER — ACYCLOVIR 800 MG PO TABS
800.0000 mg | ORAL_TABLET | Freq: Once | ORAL | Status: AC
  Administered 2011-12-26: 800 mg via ORAL
  Filled 2011-12-26: qty 1

## 2011-12-26 MED ORDER — HYDROCODONE-ACETAMINOPHEN 5-325 MG PO TABS: ORAL_TABLET | ORAL | Status: AC

## 2011-12-26 MED ORDER — ACYCLOVIR 800 MG PO TABS: 800.0000 mg | ORAL_TABLET | Freq: Every day | ORAL | Status: AC

## 2011-12-26 NOTE — ED Provider Notes (Signed)
History     CSN: [id number]  Arrival date & time 12/26/11  1716   First MD Initiated Contact with Patient 12/26/11 1758      Chief Complaint  Patient presents with  . Rash    (Consider location/radiation/quality/duration/timing/severity/associated sxs/prior treatment) HPI Comments: Patient was seen here several days ago for redness and swelling to her left eye,  And advised to return if the sx's worsen.  She returns today with red , painful rash to her hairline and left forehead.  States the antibiotics are not helping.  She denies fever, visual changes or pain to the left eye.    Patient is a 41 y.o. female presenting with rash. The history is provided by the patient.  Rash  This is a new problem. The current episode started more than 2 days ago. The problem has been gradually worsening. The problem is associated with nothing. There has been no fever. The rash is present on the scalp and face. The pain is moderate. The pain has been constant since onset. Associated symptoms include blisters, itching and pain. Treatments tried: oral antibiotics. The treatment provided no relief.    Past Medical History  Diagnosis Date  . Anxiety   . Depression     Past Surgical History  Procedure Date  . Tubal ligation   . Eye surgery     No family history on file.  History  Substance Use Topics  . Smoking status: Never Smoker   . Smokeless tobacco: Not on file  . Alcohol Use: No    OB History    Grav Para Term Preterm Abortions TAB SAB Ect Mult Living                  Review of Systems  Constitutional: Negative for fever, activity change and appetite change.  HENT: Negative for facial swelling, neck pain and neck stiffness.   Eyes: Negative for pain, discharge and visual disturbance.  Skin: Positive for itching and rash.  Neurological: Negative for dizziness, facial asymmetry and headaches.  All other systems reviewed and are negative.    Allergies  Cortisone base; Sulfa  antibiotics; Toradol; and Ultram  Home Medications   Current Outpatient Rx  Name Route Sig Dispense Refill  . ALPRAZOLAM 1 MG PO TABS Oral Take 1 mg by mouth 3 (three) times daily as needed. For anxiety    . CITALOPRAM HYDROBROMIDE 20 MG PO TABS Oral Take 20 mg by mouth every morning.     Marland Kitchen QUETIAPINE FUMARATE 50 MG PO TABS Oral Take 50-100 mg by mouth 2 (two) times daily. Take one every evening and two at bedtime    . AMOXICILLIN 250 MG PO CAPS Oral Take 1 capsule (250 mg total) by mouth 3 (three) times daily. 21 capsule 0  . OXYCODONE-ACETAMINOPHEN 5-325 MG PO TABS Oral Take 1 tablet by mouth every 4 (four) hours as needed for pain. 12 tablet 0    Pulse 76  Temp 98.1 F (36.7 C)  Resp 16  Ht 5\' 7"  (1.702 m)  Wt 200 lb (90.719 kg)  BMI 31.32 kg/m2  SpO2 100%  LMP 12/11/2011  Physical Exam  Nursing note and vitals reviewed. Constitutional: She is oriented to person, place, and time. She appears well-developed and well-nourished. No distress.  HENT:  Head: Head is without left periorbital erythema.    Left Ear: Tympanic membrane and ear canal normal. No mastoid tenderness. No hemotympanum.  Mouth/Throat: Uvula is midline, oropharynx is clear and moist and mucous  membranes are normal. No uvula swelling.       Scattered areas of grouped vesicles to the left hairline and upper face.  Nose is spared.  Eyes: Conjunctivae and EOM are normal. Pupils are equal, round, and reactive to light.  Neck: Normal range of motion. Neck supple.  Cardiovascular: Normal rate, regular rhythm, normal heart sounds and intact distal pulses.   No murmur heard. Pulmonary/Chest: Effort normal and breath sounds normal. No respiratory distress.  Musculoskeletal: Normal range of motion.  Lymphadenopathy:    She has no cervical adenopathy.  Neurological: She is alert and oriented to person, place, and time. She exhibits normal muscle tone. Coordination normal.  Skin: Rash noted. There is erythema.        See HENT exam    ED Course  Procedures (including critical care time)       MDM   Patient was seen here on 12/22/2011 similar symptoms. ED chart and nursing notes were reviewed by me.  Patient had slit lamp exam at that time without acute findings.    Group vesicles are present to the hairline of the scalp, left for head, and lateral left face. Left preauricular lymph nodes is present. Symptoms are consistent with herpes zoster.  Patient was also seen by EDP.  I will prescribe acyclovir and norco.  She agrees to return here if needed.  Patient / Family / Caregiver understand and agree with initial ED impression and plan with expectations set for ED visit. Pt stable in ED with no significant deterioration in condition. Pt feels improved after observation and/or treatment in ED.    Rani Sisney L. [place], [place] 12/27/11 1844

## 2011-12-26 NOTE — ED Notes (Signed)
Rash to left side of face extended up to hair  Line. Pt instructed to come back if worse.

## 2011-12-26 NOTE — ED Notes (Signed)
Rash to forehead,scalp and lt periorbital area for 1 week.  Seen here for same and no better.  Rash is burning and painful.

## 2011-12-26 NOTE — Discharge Instructions (Signed)

## 2011-12-28 NOTE — ED Provider Notes (Signed)
Medical screening examination/treatment/procedure(s) were conducted as a shared visit with non-physician practitioner(s) and myself.  I personally evaluated the patient during the encounter Has herpetic rash in the distribution of the first division of the left trigeminal nerve without apparent eye involvement.  Rx with antivirals, pain medicine.  Carleene Cooper III, MD 12/28/11 [phone]

## 2012-01-27 NOTE — ED Notes (Signed)
Pt called and requested that staff call pharmacy to allow pharmacy to fill prescriptions written on April 12th.  Spoke with Dr. Clarene Duke and was told ok to fill prescriptions but that pt could not get the oxycodone without filling the antibiotic.  Explained to pharmacy and to pt.

## 2012-02-17 ENCOUNTER — Encounter (HOSPITAL_COMMUNITY): Payer: Self-pay

## 2012-02-17 ENCOUNTER — Emergency Department (HOSPITAL_COMMUNITY): Payer: Self-pay

## 2012-02-17 ENCOUNTER — Emergency Department (HOSPITAL_COMMUNITY)
Admission: EM | Admit: 2012-02-17 | Discharge: 2012-02-17 | Disposition: A | Discharge status: Home / Self Care | Payer: Self-pay | Attending: Emergency Medicine | Admitting: Emergency Medicine

## 2012-02-17 DIAGNOSIS — F411 Generalized anxiety disorder: Secondary | ICD-10-CM | POA: Insufficient documentation

## 2012-02-17 DIAGNOSIS — M545 Low back pain, unspecified: Secondary | ICD-10-CM | POA: Insufficient documentation

## 2012-02-17 DIAGNOSIS — F329 Major depressive disorder, single episode, unspecified: Secondary | ICD-10-CM | POA: Insufficient documentation

## 2012-02-17 DIAGNOSIS — F3289 Other specified depressive episodes: Secondary | ICD-10-CM | POA: Insufficient documentation

## 2012-02-17 DIAGNOSIS — N390 Urinary tract infection, site not specified: Secondary | ICD-10-CM | POA: Insufficient documentation

## 2012-02-17 DIAGNOSIS — G8929 Other chronic pain: Secondary | ICD-10-CM | POA: Insufficient documentation

## 2012-02-17 HISTORY — DX: Unspecified injury of lower back, initial encounter: S39.92XA

## 2012-02-17 HISTORY — DX: Dorsalgia, unspecified: M54.9

## 2012-02-17 HISTORY — DX: Migraine, unspecified, not intractable, without status migrainosus: G43.909

## 2012-02-17 HISTORY — DX: Other chronic pain: G89.29

## 2012-02-17 LAB — URINE MICROSCOPIC-ADD ON

## 2012-02-17 LAB — URINALYSIS, ROUTINE W REFLEX MICROSCOPIC
Bilirubin Urine: NEGATIVE
Ketones, ur: NEGATIVE mg/dL
Nitrite: NEGATIVE
Protein, ur: NEGATIVE mg/dL

## 2012-02-17 MED ORDER — DIAZEPAM 5 MG PO TABS
5.0000 mg | ORAL_TABLET | Freq: Once | ORAL | Status: AC
  Administered 2012-02-17: 5 mg via ORAL
  Filled 2012-02-17: qty 1

## 2012-02-17 MED ORDER — CYCLOBENZAPRINE HCL 10 MG PO TABS: 10.0000 mg | ORAL_TABLET | Freq: Three times a day (TID) | ORAL | Status: AC | PRN

## 2012-02-17 MED ORDER — CEPHALEXIN 500 MG PO CAPS
500.0000 mg | ORAL_CAPSULE | Freq: Once | ORAL | Status: AC
  Administered 2012-02-17: 500 mg via ORAL
  Filled 2012-02-17: qty 1

## 2012-02-17 MED ORDER — OXYCODONE-ACETAMINOPHEN 5-325 MG PO TABS
ORAL_TABLET | ORAL | Status: AC
Start: 2012-02-17 — End: 2012-02-27

## 2012-02-17 MED ORDER — HYDROMORPHONE HCL PF 1 MG/ML IJ SOLN
1.0000 mg | Freq: Once | INTRAMUSCULAR | Status: AC
  Administered 2012-02-17: 1 mg via INTRAMUSCULAR
  Filled 2012-02-17: qty 1

## 2012-02-17 MED ORDER — CEPHALEXIN 500 MG PO CAPS: 500.0000 mg | ORAL_CAPSULE | Freq: Four times a day (QID) | ORAL | Status: AC

## 2012-02-17 MED ORDER — OXYCODONE-ACETAMINOPHEN 5-325 MG PO TABS
2.0000 | ORAL_TABLET | Freq: Once | ORAL | Status: AC
  Administered 2012-02-17: 2 via ORAL
  Filled 2012-02-17: qty 2

## 2012-02-17 NOTE — ED Notes (Signed)
Pt still unable to void at this time.  Pt refused in and out cath.  Informed pt to obtain urine sample ASAP.  Verbalized understanding.
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
## 2012-02-17 NOTE — ED Provider Notes (Signed)
History   This chart was scribed for Laray Anger, DO scribed by [employer]. The patient was seen in room [facility]11/[facility]11 seen at 15:50    CSN: [id number]  Arrival date & time 02/17/12  1523   First MD Initiated Contact with Patient 02/17/12 1537      Chief Complaint  Patient presents with  . Back Pain    HPI Pamela May is a 41 y.o. female who presents to the Emergency Department complaining of gradual onset and persistence of constant acute flair of her chronic low back "pain" for the past several days.  Pain worsens with palpation of the area and body position changes.  Denies any change in her usual chronic pain pattern.  Denies incont/retention of bowel or bladder, no saddle anesthesia, no focal motor weakness, no tingling/numbness in extremities, no fevers, no injury.   The symptoms have been associated with no other complaints. The patient has a significant history of similar symptoms previously and has had multiple prior evals for chronic pain complaints.     Past Medical History  Diagnosis Date  . Anxiety   . Depression   . Back injury   . Chronic back pain   . Migraine headache     Past Surgical History  Procedure Date  . Tubal ligation   . Eye surgery     History  Substance Use Topics  . Smoking status: Never Smoker   . Smokeless tobacco: Not on file  . Alcohol Use: No    Review of Systems ROS: Statement: All systems negative except as marked or noted in the HPI; Constitutional: Negative for fever and chills. ; ; Eyes: Negative for eye pain, redness and discharge. ; ; ENMT: Negative for ear pain, hoarseness, nasal congestion, sinus pressure and sore throat. ; ; Cardiovascular: Negative for chest pain, palpitations, diaphoresis, dyspnea and peripheral edema. ; ; Respiratory: Negative for cough, wheezing and stridor. ; ; Gastrointestinal: Negative for nausea, vomiting, diarrhea, abdominal pain, blood in stool, hematemesis, jaundice and rectal bleeding. . ; ;  Genitourinary: Negative for dysuria, flank pain and hematuria. ; ; Musculoskeletal: +LBP. Negative for neck pain. Negative for swelling and trauma.; ; Skin: Negative for pruritus, rash, abrasions, blisters, bruising and skin lesion.; ; Neuro: Negative for headache, lightheadedness and neck stiffness. Negative for weakness, altered level of consciousness , altered mental status, extremity weakness, paresthesias, involuntary movement, seizure and syncope.     Allergies  Cortisone base; Ketorolac tromethamine; Sulfa antibiotics; and Ultram  Home Medications   Current Outpatient Rx  Name Route Sig Dispense Refill  . ALPRAZOLAM 1 MG PO TABS Oral Take 1 mg by mouth 3 (three) times daily as needed. For anxiety    . CITALOPRAM HYDROBROMIDE 20 MG PO TABS Oral Take 20 mg by mouth every morning.     Marland Kitchen QUETIAPINE FUMARATE 100 MG PO TABS Oral Take 100 mg by mouth at bedtime.      BP 115/72  Pulse 95  Temp(Src) 98.2 F (36.8 C) (Oral)  Resp 18  SpO2 100%  LMP 02/10/2012  Physical Exam 1555: Physical examination:  Nursing notes reviewed; Vital signs and O2 SAT reviewed;  Constitutional: Well developed, Well nourished, Well hydrated, In no acute distress; Head:  Normocephalic, atraumatic; Eyes: EOMI, PERRL, No scleral icterus; ENMT: Mouth and pharynx normal, Mucous membranes moist; Neck: Supple, Full range of motion, No lymphadenopathy; Cardiovascular: Regular rate and rhythm, No murmur or gallop; Respiratory: Breath sounds clear & equal bilaterally, No rales, rhonchi, wheezes, Normal  respiratory effort/excursion; Chest: Nontender, Movement normal; Abdomen: Soft, Nontender, Nondistended, Normal bowel sounds; Genitourinary: No CVA tenderness; Spine:  No midline CS, TS, LS tenderness.  +TTP left lumbar paraspinal muscles; Extremities: Pulses normal, No tenderness, No edema, No calf edema or asymmetry.; Neuro: AA&Ox3, Major CN grossly intact. Strength 5/5 equal bilat UE's and LE's, including great toe  dorsiflexion.  DTR 2/4 equal bilat UE's and LE's.  No gross sensory deficits.  Neg straight leg raises bilat.  Gait steady.; Skin: Color normal, Warm, Dry.   ED Course  Procedures   MDM  MDM Reviewed: nursing note, vitals and previous chart Interpretation: labs and x-ray   Results for orders placed during the hospital encounter of 02/17/12  URINALYSIS, ROUTINE W REFLEX MICROSCOPIC      Component Value Range   Color, Urine YELLOW  YELLOW    APPearance CLOUDY (*) CLEAR    Specific Gravity, Urine 1.010  1.005 - 1.030    pH 8.5 (*) 5.0 - 8.0    Glucose, UA NEGATIVE  NEGATIVE (mg/dL)   Hgb urine dipstick NEGATIVE  NEGATIVE    Bilirubin Urine NEGATIVE  NEGATIVE    Ketones, ur NEGATIVE  NEGATIVE (mg/dL)   Protein, ur NEGATIVE  NEGATIVE (mg/dL)   Urobilinogen, UA 0.2  0.0 - 1.0 (mg/dL)   Nitrite NEGATIVE  NEGATIVE    Leukocytes, UA TRACE (*) NEGATIVE   POCT PREGNANCY, URINE      Component Value Range   Preg Test, Ur NEGATIVE  NEGATIVE   URINE MICROSCOPIC-ADD ON      Component Value Range   Squamous Epithelial / LPF MANY (*) RARE    WBC, UA 7-10  <3 (WBC/hpf)   Bacteria, UA MANY (*) RARE    Dg Lumbar Spine Complete 02/17/2012  *RADIOLOGY REPORT*  Clinical Data: Low back pain  LUMBAR SPINE - COMPLETE 4+ VIEW  Comparison: None.  Findings: Five lumbar-type vertebral bodies.  Normal lumbar lordosis.  No evidence of fracture or dislocation.  Vertebral body heights and intervertebral spaces are maintained.  Very mild multilevel degenerative changes.  Visualized bony pelvis appears intact.  IMPRESSION: No fracture or dislocation is seen.  Original Report Authenticated By: Charline Bills, M.D.     6:21 PM:  Pt wants to go home now.  Will tx for UTI and symptomatically for pain.  Dx testing d/w pt.  Questions answered.  Verb understanding, agreeable to d/c home with outpt f/u.          I personally performed the services described in this documentation, which was scribed in my  presence. The recorded information has been reviewed and considered. Ingvald Theisen Allison Quarry, DO 02/19/12 1935

## 2012-02-17 NOTE — ED Notes (Signed)
Pt unable to obtain urine sample at this time.  Informed pt of needed sample before x-ray could be completed.  Pt verbalized understanding.

## 2012-02-17 NOTE — ED Notes (Signed)
Pt produced a very small amount of urine. Upreg obtained, which was negative. Informed pt that the rest of the specimen was sent for UA but she may have to attempt to recollect if there is not enough for testing.

## 2012-02-17 NOTE — Discharge Instructions (Signed)
RESOURCE GUIDE  Chronic Pain Problems: Contact [facility]  [phone] Patients need to be referred by their primary care doctor.  Insufficient Money for Medicine: Contact United Way:  call "211" or [facility] [phone].  No Primary Care Doctor: - Call [facility]  [phone] - can help you locate a primary care doctor that  accepts your insurance, provides certain services, etc. - Physician Referral Service- 1-800-533-3463  Agencies that provide inexpensive medical care: - [facility]  [phone] - [facility]  [phone] - [facility]  [phone] - [facility]  [phone] - Planned Parenthood  [phone] - [facility]  [phone]  Medicaid-accepting [place] Providers: - [facility]- [address]  [phone], Mon-Fri 9am-7pm, Sat 9am-1pm - [facility]- [address], [address]  [phone] - [facility]- [address], [address]  [phone] - [facility]- [address] [place] [address]  [phone] - Veita B[facility]- [address], [phone]  Only accepts [place] Access Medicaid patients after they have their name  applied to their card  Self Pay (no insurance) in [place]: - Sickle Cell Patients: Dr Eric Dean, [facility]  [address], [phone] - [facility]- [address]  [phone]       -     [facility]- [address]       -     [facility]- see information above (Speak to Pam H if you do not have insurance)       -  [facility]- [address], [phone]       -  [facility] [place]- [address],  [phone]       -  [facility]- [address] [place] [address], [phone]       -  Dr Osei-Bonsu-  [address], [place], [phone]       -  [facility]- [address], [phone]       -  [facility]- [address] [place] [address], [phone], also [address], [phone]       -    [facility]- [address], [phone], 1st & 3rd Saturday   every month, 10am-1pm  1) Find a Doctor and Pay Out of Pocket Although you won't have to find out who is covered by your insurance plan, it is a good idea to ask around and get recommendations. You will then need to call the office and see if the doctor you have chosen will accept you as a new patient and what types of options they offer for patients who are self-pay. Some doctors offer discounts or will set up payment plans for their patients who do not have insurance, but you will need to ask so you aren't surprised when you get to your appointment.  2) Contact Your Local Health Department Not all health departments have doctors that can see patients for sick visits, but many do, so it is worth a call to see if yours does. If you don't know where your local health department is, you can check in your phone book. The CDC also has a tool to help you locate your state's health department, and many state websites also have   listings of all of their local health departments.  3) Find a Walk-in Clinic If your illness is not likely to be very severe or complicated, you may want to try a walk in clinic. These are popping up all over the country in pharmacies, drugstores, and shopping centers. They're usually staffed by nurse practitioners or physician assistants that have been trained to treat common illnesses and complaints. They're usually fairly quick and inexpensive. However, if you have serious medical issues or chronic medical problems, these are probably not your best option  STD Testing - [place] [facility] [place], [facility], [address], [place], phone [phone] or 1-877-539-9860.  Monday - Friday, call for an appointment. - [place]  [facility] [place], [facility], [address], [place], phone [phone] or 1-877-539-9860.  Monday - Friday, call for an appointment.  Abuse/Neglect: - [place] Child Abuse Hotline (336) 641-3795 - [place] Child Abuse Hotline 800-378-5315 (After Hours)  Emergency Shelter:  [facility] (336) 271-5985  Maternity Homes: - [facility] (336) 275-9566 - [facility] (704) 372-4663  MRSA Hotline #:   832-7006  [place] Resources  [facility] [place]  [employer] [place] [facility] [address]                 [address]         [address]  [place]                                               [place]                              [place] Phone:  [phone]                                  Phone:  [phone]                   Phone:  [phone]  [place] [facility], [phone] - [place] Services - [facility]- 1-888-581-9988       -     [facility] in [place], [address],                                  336-349-4454, Insurance  [place] Child Abuse Hotline (336) 342-1394 or (336) 342-3537 (After Hours)   Behavioral Health Services  Substance Abuse Resources: - [facility]  336-882-2125 - [facility] 336-784-9470 - [facility] 336-285-9073 - [facility] 336-845-3988 - Residential & Outpatient Substance Abuse Program  800-659-3381  Psychological Services: - [facility]  [phone] - [facility]  [phone] - [place], [address], [place], ACCESS LINE: 1-800-853-5163 or 336-641-4981, Http://www.[facility].com/services/adult.htm  Dental Assistance  If unable to pay or uninsured, contact:  [facility] or [place] [facility] to become qualified for the adult dental  clinic.  Patients with Medicaid: [facility]  Dental [address], [phone], [phone]  If unable   to pay, or uninsured, contact [facility] ([phone]) or [place] [facility] (641-3152 in , [phone] in [place]) to become qualified for the adult dental clinic  Other Low-Cost Community Dental Services: - [facility]- [address], [place], [place], [address], [phone], [address], 2nd and 4th Thursday of the month at 6:30am.  10 clients each day by appointment, can sometimes see walk-in patients if someone does not show for an appointment. - [facility]- [address], [place], [place], [address], [phone] - [facility]- [address], [place], [place], [address], [phone] - [place] [facility]- [phone] - [facility]- [phone] - [facility]- [phone]     Take the prescriptions as directed.  Apply moist heat or ice to the area(s) of discomfort, for 15 minutes at a time, several times per day for the next few days.  Do not fall asleep on a heating or ice pack.  Call your regular medical doctor on Monday to schedule a follow up appointment this week.  Return to the Emergency Department immediately if worsening.  

## 2012-03-09 ENCOUNTER — Encounter (HOSPITAL_COMMUNITY): Payer: Self-pay | Admitting: Emergency Medicine

## 2012-03-09 ENCOUNTER — Emergency Department (HOSPITAL_COMMUNITY)
Admission: EM | Admit: 2012-03-09 | Discharge: 2012-03-09 | Disposition: A | Discharge status: Home / Self Care | Payer: Self-pay | Attending: Emergency Medicine | Admitting: Emergency Medicine

## 2012-03-09 DIAGNOSIS — Z79899 Other long term (current) drug therapy: Secondary | ICD-10-CM | POA: Insufficient documentation

## 2012-03-09 DIAGNOSIS — K0889 Other specified disorders of teeth and supporting structures: Secondary | ICD-10-CM

## 2012-03-09 DIAGNOSIS — F3289 Other specified depressive episodes: Secondary | ICD-10-CM | POA: Insufficient documentation

## 2012-03-09 DIAGNOSIS — Z886 Allergy status to analgesic agent status: Secondary | ICD-10-CM | POA: Insufficient documentation

## 2012-03-09 DIAGNOSIS — Z882 Allergy status to sulfonamides status: Secondary | ICD-10-CM | POA: Insufficient documentation

## 2012-03-09 DIAGNOSIS — K089 Disorder of teeth and supporting structures, unspecified: Secondary | ICD-10-CM | POA: Insufficient documentation

## 2012-03-09 DIAGNOSIS — Z888 Allergy status to other drugs, medicaments and biological substances status: Secondary | ICD-10-CM | POA: Insufficient documentation

## 2012-03-09 DIAGNOSIS — F329 Major depressive disorder, single episode, unspecified: Secondary | ICD-10-CM | POA: Insufficient documentation

## 2012-03-09 DIAGNOSIS — F411 Generalized anxiety disorder: Secondary | ICD-10-CM | POA: Insufficient documentation

## 2012-03-09 MED ORDER — AMOXICILLIN 500 MG PO CAPS: 500.0000 mg | ORAL_CAPSULE | Freq: Three times a day (TID) | ORAL | Status: AC

## 2012-03-09 NOTE — ED Provider Notes (Signed)
History     CSN: [id number]  Arrival date & time 03/09/12  1134   None     Chief Complaint  Patient presents with  . Dental Pain    (Consider location/radiation/quality/duration/timing/severity/associated sxs/prior treatment) Patient is a 41 y.o. female presenting with tooth pain. The history is provided by the patient.  Dental PainThe primary symptoms include mouth pain and headaches. Primary symptoms do not include shortness of breath or cough. The symptoms began 3 to 5 days ago. The symptoms are worsening. The symptoms occur constantly.  The headache is not associated with photophobia.  Additional symptoms include: gum swelling and jaw pain. Medical issues do not include: alcohol problem and smoking.    Past Medical History  Diagnosis Date  . Anxiety   . Depression   . Back injury   . Chronic back pain   . Migraine headache     Past Surgical History  Procedure Date  . Tubal ligation   . Eye surgery     Family History  Problem Relation Age of Onset  . Diabetes Other   . Heart failure Other     History  Substance Use Topics  . Smoking status: Never Smoker   . Smokeless tobacco: Never Used  . Alcohol Use: No    OB History    Grav Para Term Preterm Abortions TAB SAB Ect Mult Living   4 4 4       4       Review of Systems  Constitutional: Negative for activity change.       All ROS Neg except as noted in HPI  HENT: Positive for dental problem. Negative for neck pain.   Eyes: Negative for photophobia and discharge.  Respiratory: Negative for cough, shortness of breath and wheezing.   Cardiovascular: Negative for chest pain and palpitations.  Gastrointestinal: Negative for abdominal pain and blood in stool.  Genitourinary: Negative for dysuria, frequency and hematuria.  Musculoskeletal: Positive for back pain. Negative for arthralgias.  Skin: Negative.   Neurological: Positive for headaches. Negative for dizziness, seizures and speech difficulty.    Psychiatric/Behavioral: Negative for hallucinations and confusion.    Allergies  Cortisone base; Ketorolac tromethamine; Sulfa antibiotics; and Ultram  Home Medications   Current Outpatient Rx  Name Route Sig Dispense Refill  . ACETAMINOPHEN 500 MG PO TABS Oral Take 500 mg by mouth every 6 (six) hours as needed. For tooth ache    . ALPRAZOLAM 1 MG PO TABS Oral Take 1 mg by mouth 3 (three) times daily as needed. For anxiety    . CITALOPRAM HYDROBROMIDE 20 MG PO TABS Oral Take 20 mg by mouth every morning.     Marland Kitchen QUETIAPINE FUMARATE 100 MG PO TABS Oral Take 100 mg by mouth at bedtime.      BP 107/68  Pulse 77  Temp 98 F (36.7 C) (Oral)  Resp 16  Ht 5\' 7"  (1.702 m)  Wt 205 lb (92.987 kg)  BMI 32.11 kg/m2  SpO2 100%  LMP 02/06/2012  Physical Exam  Nursing note and vitals reviewed. Constitutional: She is oriented to person, place, and time. She appears well-developed and well-nourished.  Non-toxic appearance.  HENT:  Head: Normocephalic.  Right Ear: Tympanic membrane and external ear normal.  Left Ear: Tympanic membrane and external ear normal.       Patient has multiple severely decayed teeth of the upper and lower gum areas. The airway is patent. The tongue is without trauma and in the midline. Speech is understandable.  Eyes: EOM and lids are normal. Pupils are equal, round, and reactive to light.  Neck: Normal range of motion. Neck supple. Carotid bruit is not present.  Cardiovascular: Normal rate, regular rhythm, normal heart sounds, intact distal pulses and normal pulses.   Pulmonary/Chest: Breath sounds normal. No respiratory distress.  Abdominal: Soft. Bowel sounds are normal. There is no tenderness. There is no guarding.  Musculoskeletal: Normal range of motion.  Lymphadenopathy:       Head (right side): No submandibular adenopathy present.       Head (left side): No submandibular adenopathy present.    She has no cervical adenopathy.  Neurological: She is alert and  oriented to person, place, and time. She has normal strength. No cranial nerve deficit or sensory deficit.  Skin: Skin is warm and dry.  Psychiatric: She has a normal mood and affect. Her speech is normal.    ED Course  Procedures (including critical care time)  Labs Reviewed - No data to display No results found.   No diagnosis found.    MDM  I have reviewed nursing notes, vital signs, and all appropriate lab and imaging results for this patient. Patient states she's had multiple problems with her teeth in the past. On June 27, she was eating pizza when a tooth broke, along with a tooth with a crown also broke. The patient presented to the emergency department for evaluation. Prescription for amoxicillin 13 times daily given. Patient is advised to use 2 extra strength Tylenol and 3 ibuprofen tablets 3 times daily for pain and discomfort. She is making arrangements to see a dentist. She states she does not have dental insurance and does not have the money to go to a private dentist at this time.       Kathie Dike, [place] 03/09/12 1236

## 2012-03-09 NOTE — ED Notes (Signed)
Patient c/o upper dental pain. Per patient bite into a piece of pizza on Thursday and left upper, lateral incisor came out. Per patient had a root canal on that tooth done last year.

## 2012-03-09 NOTE — Discharge Instructions (Signed)
Please use amoxicillin 3 times daily with food. Please use to Tylenol, and 3 ibuprofen tablets 3 times daily with food. Please see a dentist for detail evaluation of your dental problem.Dental Pain A tooth ache may be caused by cavities (tooth decay). Cavities expose the nerve of the tooth to air and hot or cold temperatures. It may come from an infection or abscess (also called a boil or furuncle) around your tooth. It is also often caused by dental caries (tooth decay). This causes the pain you are having. DIAGNOSIS  Your caregiver can diagnose this problem by exam. TREATMENT   If caused by an infection, it may be treated with medications which kill germs (antibiotics) and pain medications as prescribed by your caregiver. Take medications as directed.   Only take over-the-counter or prescription medicines for pain, discomfort, or fever as directed by your caregiver.   Whether the tooth ache today is caused by infection or dental disease, you should see your dentist as soon as possible for further care.  SEEK MEDICAL CARE IF: The exam and treatment you received today has been provided on an emergency basis only. This is not a substitute for complete medical or dental care. If your problem worsens or new problems (symptoms) appear, and you are unable to meet with your dentist, call or return to this location. SEEK IMMEDIATE MEDICAL CARE IF:   You have a fever.   You develop redness and swelling of your face, jaw, or neck.   You are unable to open your mouth.   You have severe pain uncontrolled by pain medicine.  MAKE SURE YOU:   Understand these instructions.   Will watch your condition.   Will get help right away if you are not doing well or get worse.  Document Released: 08/28/2005 Document Revised: 08/17/2011 Document Reviewed: 04/15/2008 [facility] Patient Information 2012 [place], [place].

## 2012-03-09 NOTE — ED Provider Notes (Signed)
Medical screening examination/treatment/procedure(s) were performed by non-physician practitioner and as supervising physician I was immediately available for consultation/collaboration. Devoria Albe, MD, Armando Gang   Ward Givens, MD 03/09/12 [phone]

## 2012-07-01 ENCOUNTER — Emergency Department (HOSPITAL_COMMUNITY)
Admission: EM | Admit: 2012-07-01 | Discharge: 2012-07-01 | Disposition: A | Discharge status: Home / Self Care | Payer: Self-pay | Attending: Emergency Medicine | Admitting: Emergency Medicine

## 2012-07-01 ENCOUNTER — Encounter (HOSPITAL_COMMUNITY): Payer: Self-pay | Admitting: *Deleted

## 2012-07-01 DIAGNOSIS — K089 Disorder of teeth and supporting structures, unspecified: Secondary | ICD-10-CM | POA: Insufficient documentation

## 2012-07-01 DIAGNOSIS — Z8669 Personal history of other diseases of the nervous system and sense organs: Secondary | ICD-10-CM | POA: Insufficient documentation

## 2012-07-01 DIAGNOSIS — F411 Generalized anxiety disorder: Secondary | ICD-10-CM | POA: Insufficient documentation

## 2012-07-01 DIAGNOSIS — Z87828 Personal history of other (healed) physical injury and trauma: Secondary | ICD-10-CM | POA: Insufficient documentation

## 2012-07-01 DIAGNOSIS — Z8659 Personal history of other mental and behavioral disorders: Secondary | ICD-10-CM | POA: Insufficient documentation

## 2012-07-01 DIAGNOSIS — K0889 Other specified disorders of teeth and supporting structures: Secondary | ICD-10-CM

## 2012-07-01 DIAGNOSIS — Z8739 Personal history of other diseases of the musculoskeletal system and connective tissue: Secondary | ICD-10-CM | POA: Insufficient documentation

## 2012-07-01 MED ORDER — HYDROCODONE-ACETAMINOPHEN 5-325 MG PO TABS
2.0000 | ORAL_TABLET | Freq: Once | ORAL | Status: AC
  Administered 2012-07-01: 2 via ORAL
  Filled 2012-07-01: qty 2

## 2012-07-01 MED ORDER — HYDROCODONE-ACETAMINOPHEN 5-325 MG PO TABS: 2.0000 | ORAL_TABLET | ORAL | Status: DC | PRN

## 2012-07-01 MED ORDER — PENICILLIN V POTASSIUM 500 MG PO TABS: 500.0000 mg | ORAL_TABLET | Freq: Four times a day (QID) | ORAL | Status: AC

## 2012-07-01 NOTE — ED Provider Notes (Signed)
History     CSN: [id number]  Arrival date & time 07/01/12  2000   First MD Initiated Contact with Patient 07/01/12 2101      Chief Complaint  Patient presents with  . Dental Pain    (Consider location/radiation/quality/duration/timing/severity/associated sxs/prior treatment) Patient is a 41 y.o. female presenting with tooth pain. The history is provided by the patient. No language interpreter was used.  Dental PainThe primary symptoms include mouth pain. The symptoms began 2 days ago. The symptoms are worsening. The symptoms occur constantly.  Additional symptoms include: gum swelling.    Past Medical History  Diagnosis Date  . Anxiety   . Depression   . Back injury   . Chronic back pain   . Migraine headache     Past Surgical History  Procedure Date  . Tubal ligation   . Eye surgery     Family History  Problem Relation Age of Onset  . Diabetes Other   . Heart failure Other     History  Substance Use Topics  . Smoking status: Never Smoker   . Smokeless tobacco: Never Used  . Alcohol Use: No    OB History    Grav Para Term Preterm Abortions TAB SAB Ect Mult Living   4 4 4       4       Review of Systems  All other systems reviewed and are negative.    Allergies  Cortisone base; Ketorolac tromethamine; Sulfa antibiotics; and Ultram  Home Medications   Current Outpatient Rx  Name Route Sig Dispense Refill  . ACETAMINOPHEN 500 MG PO TABS Oral Take 500 mg by mouth every 6 (six) hours as needed. For tooth ache    . ALPRAZOLAM 1 MG PO TABS Oral Take 1 mg by mouth 3 (three) times daily as needed. For anxiety    . CITALOPRAM HYDROBROMIDE 20 MG PO TABS Oral Take 20 mg by mouth every morning.     Marland Kitchen QUETIAPINE FUMARATE 100 MG PO TABS Oral Take 100 mg by mouth at bedtime.      BP 128/68  Pulse 81  Temp 98.7 F (37.1 C) (Oral)  Resp 20  Ht 5\' 7"  (1.702 m)  Wt 210 lb (95.255 kg)  BMI 32.89 kg/m2  SpO2 98%  LMP 06/11/2012  Physical Exam  Nursing  note and vitals reviewed. Constitutional: She appears well-developed and well-nourished.  HENT:  Head: Normocephalic and atraumatic.       Multiple broken teeth  Eyes: Pupils are equal, round, and reactive to light.  Neck: Normal range of motion.  Cardiovascular: Normal rate.   Pulmonary/Chest: Effort normal.  Musculoskeletal: Normal range of motion.  Neurological: She is alert.  Skin: Skin is warm.    ED Course  Procedures (including critical care time)  Labs Reviewed - No data to display No results found.   No diagnosis found.    MDM  pcn vk  And hydrocodone dental referral        Elson Areas, [place] 07/01/12 2113

## 2012-07-01 NOTE — ED Notes (Signed)
Dental pain for 2 days, says her tooth broke .

## 2012-07-03 NOTE — ED Provider Notes (Signed)
Medical screening examination/treatment/procedure(s) were performed by non-physician practitioner and as supervising physician I was immediately available for consultation/collaboration.  John-Adam Manasvi Dickard, M.D.     John-Adam Clarrissa Shimkus, MD 07/03/12 1602 

## 2012-07-15 ENCOUNTER — Encounter (HOSPITAL_COMMUNITY): Payer: Self-pay | Admitting: Emergency Medicine

## 2012-07-15 ENCOUNTER — Emergency Department (HOSPITAL_COMMUNITY)
Admission: EM | Admit: 2012-07-15 | Discharge: 2012-07-15 | Disposition: A | Discharge status: Home / Self Care | Payer: Self-pay | Attending: Emergency Medicine | Admitting: Emergency Medicine

## 2012-07-15 DIAGNOSIS — F3289 Other specified depressive episodes: Secondary | ICD-10-CM | POA: Insufficient documentation

## 2012-07-15 DIAGNOSIS — F411 Generalized anxiety disorder: Secondary | ICD-10-CM | POA: Insufficient documentation

## 2012-07-15 DIAGNOSIS — Z8669 Personal history of other diseases of the nervous system and sense organs: Secondary | ICD-10-CM | POA: Insufficient documentation

## 2012-07-15 DIAGNOSIS — K0889 Other specified disorders of teeth and supporting structures: Secondary | ICD-10-CM

## 2012-07-15 DIAGNOSIS — K089 Disorder of teeth and supporting structures, unspecified: Secondary | ICD-10-CM | POA: Insufficient documentation

## 2012-07-15 DIAGNOSIS — F329 Major depressive disorder, single episode, unspecified: Secondary | ICD-10-CM | POA: Insufficient documentation

## 2012-07-15 MED ORDER — HYDROCODONE-ACETAMINOPHEN 5-325 MG PO TABS
ORAL_TABLET | ORAL | Status: AC
  Administered 2012-07-15: 1
  Filled 2012-07-15: qty 1

## 2012-07-15 MED ORDER — PENICILLIN V POTASSIUM 500 MG PO TABS: 500.0000 mg | ORAL_TABLET | Freq: Four times a day (QID) | ORAL | Status: AC

## 2012-07-15 MED ORDER — HYDROCODONE-ACETAMINOPHEN 5-325 MG PO TABS: 1.0000 | ORAL_TABLET | Freq: Four times a day (QID) | ORAL | Status: AC | PRN

## 2012-07-15 NOTE — ED Notes (Signed)
Dental pain lt upper canine, caries to gum line, says her face has been swollen, minimal now.  Onset 2 days ago.

## 2012-07-15 NOTE — ED Provider Notes (Signed)
Medical screening examination/treatment/procedure(s) were performed by non-physician practitioner and as supervising physician I was immediately available for consultation/collaboration.   Shelda Jakes, MD 07/15/12 1944

## 2012-07-15 NOTE — ED Notes (Signed)
Patient complaining of upper left dental pain x 2 weeks worsening  X 2 days. States she was on antibiotics 2 weeks ago for same.

## 2012-07-15 NOTE — ED Provider Notes (Signed)
History     CSN: [id number]  Arrival date & time 07/15/12  1854   First MD Initiated Contact with Patient 07/15/12 1922      Chief Complaint  Patient presents with  . Dental Pain    (Consider location/radiation/quality/duration/timing/severity/associated sxs/prior treatment) HPI Comments: Pt apparently had an oral abscess recently.  She states she was seen here with L facial swelling and prescribed pen vk .  The swelling went down.  She has not made any dental arrangements for tooth extraction and is concerned the swelling will reccur.  Patient is a 41 y.o. female presenting with tooth pain. The history is provided by the patient. No language interpreter was used.  Dental PainThe primary symptoms include mouth pain. Primary symptoms do not include fever. Episode onset: ~ 2 weeks ago.  Additional symptoms do not include: facial swelling.    Past Medical History  Diagnosis Date  . Anxiety   . Depression   . Back injury   . Chronic back pain   . Migraine headache     Past Surgical History  Procedure Date  . Tubal ligation   . Eye surgery     Family History  Problem Relation Age of Onset  . Diabetes Other   . Heart failure Other     History  Substance Use Topics  . Smoking status: Never Smoker   . Smokeless tobacco: Never Used  . Alcohol Use: No    OB History    Grav Para Term Preterm Abortions TAB SAB Ect Mult Living   4 4 4       4       Review of Systems  Constitutional: Negative for fever and chills.  HENT: Positive for dental problem. Negative for facial swelling.   All other systems reviewed and are negative.    Allergies  Cortisone base; Ketorolac tromethamine; Sulfa antibiotics; and Ultram  Home Medications   Current Outpatient Rx  Name  Route  Sig  Dispense  Refill  . ACETAMINOPHEN 500 MG PO TABS   Oral   Take 500 mg by mouth every 6 (six) hours as needed. For tooth ache         . ALPRAZOLAM 1 MG PO TABS   Oral   Take 1 mg by mouth 3  (three) times daily as needed. For anxiety         . CITALOPRAM HYDROBROMIDE 20 MG PO TABS   Oral   Take 20 mg by mouth every morning.          Marland Kitchen HYDROCODONE-ACETAMINOPHEN 5-325 MG PO TABS   Oral   Take 2 tablets by mouth every 4 (four) hours as needed for pain.   16 tablet   0   . HYDROCODONE-ACETAMINOPHEN 5-325 MG PO TABS   Oral   Take 1 tablet by mouth every 6 (six) hours as needed for pain.   20 tablet   0   . PENICILLIN V POTASSIUM 500 MG PO TABS   Oral   Take 1 tablet (500 mg total) by mouth 4 (four) times daily.   40 tablet   0   . QUETIAPINE FUMARATE 100 MG PO TABS   Oral   Take 100 mg by mouth at bedtime.           BP 129/89  Pulse 67  Temp 98 F (36.7 C) (Oral)  Resp 20  Ht 5\' 7"  (1.702 m)  Wt 200 lb (90.719 kg)  BMI 31.32 kg/m2  SpO2 100%  LMP 06/11/2012  Physical Exam  Nursing note and vitals reviewed. Constitutional: She is oriented to person, place, and time. She appears well-developed and well-nourished. No distress.  HENT:  Head: Normocephalic and atraumatic.  Mouth/Throat: Uvula is midline, oropharynx is clear and moist and mucous membranes are normal. No uvula swelling.    Eyes: EOM are normal.  Neck: Normal range of motion.  Cardiovascular: Normal rate and regular rhythm.   Pulmonary/Chest: Effort normal.  Abdominal: Soft. She exhibits no distension. There is no tenderness.  Musculoskeletal: Normal range of motion.  Neurological: She is alert and oriented to person, place, and time.  Skin: Skin is warm and dry.  Psychiatric: She has a normal mood and affect. Judgment normal.    ED Course  Procedures (including critical care time)  Labs Reviewed - No data to display No results found.   1. Pain, dental       MDM  Pen VK, 40 Hydrocodone, 20 F/u with dentist ASAP.        Evalina Field, [place] 07/15/12 [phone]

## 2013-01-22 ENCOUNTER — Emergency Department (HOSPITAL_COMMUNITY)
Admission: EM | Admit: 2013-01-22 | Discharge: 2013-01-22 | Disposition: A | Discharge status: Home / Self Care | Payer: Self-pay | Attending: Emergency Medicine | Admitting: Emergency Medicine

## 2013-01-22 ENCOUNTER — Encounter (HOSPITAL_COMMUNITY): Payer: Self-pay

## 2013-01-22 DIAGNOSIS — F3289 Other specified depressive episodes: Secondary | ICD-10-CM | POA: Insufficient documentation

## 2013-01-22 DIAGNOSIS — F411 Generalized anxiety disorder: Secondary | ICD-10-CM | POA: Insufficient documentation

## 2013-01-22 DIAGNOSIS — M545 Low back pain, unspecified: Secondary | ICD-10-CM | POA: Insufficient documentation

## 2013-01-22 DIAGNOSIS — Z87828 Personal history of other (healed) physical injury and trauma: Secondary | ICD-10-CM | POA: Insufficient documentation

## 2013-01-22 DIAGNOSIS — Z79899 Other long term (current) drug therapy: Secondary | ICD-10-CM | POA: Insufficient documentation

## 2013-01-22 DIAGNOSIS — G8929 Other chronic pain: Secondary | ICD-10-CM | POA: Insufficient documentation

## 2013-01-22 DIAGNOSIS — F329 Major depressive disorder, single episode, unspecified: Secondary | ICD-10-CM | POA: Insufficient documentation

## 2013-01-22 DIAGNOSIS — Z8679 Personal history of other diseases of the circulatory system: Secondary | ICD-10-CM | POA: Insufficient documentation

## 2013-01-22 DIAGNOSIS — R51 Headache: Secondary | ICD-10-CM | POA: Insufficient documentation

## 2013-01-22 MED ORDER — ACETAMINOPHEN-CODEINE #3 300-30 MG PO TABS: 1.0000 | ORAL_TABLET | Freq: Four times a day (QID) | ORAL | Status: DC | PRN

## 2013-01-22 MED ORDER — PROMETHAZINE HCL 12.5 MG PO TABS
12.5000 mg | ORAL_TABLET | Freq: Once | ORAL | Status: AC
  Administered 2013-01-22: 12.5 mg via ORAL
  Filled 2013-01-22: qty 1

## 2013-01-22 MED ORDER — MORPHINE SULFATE 4 MG/ML IJ SOLN
10.0000 mg | Freq: Once | INTRAMUSCULAR | Status: AC
  Administered 2013-01-22: 10 mg via INTRAMUSCULAR
  Filled 2013-01-22: qty 3

## 2013-01-22 MED ORDER — ACETAMINOPHEN-CODEINE #3 300-30 MG PO TABS
2.0000 | ORAL_TABLET | Freq: Once | ORAL | Status: DC
  Filled 2013-01-22: qty 2

## 2013-01-22 MED ORDER — DIAZEPAM 5 MG PO TABS: 5.0000 mg | ORAL_TABLET | Freq: Three times a day (TID) | ORAL | Status: DC

## 2013-01-22 MED ORDER — DIAZEPAM 5 MG PO TABS
5.0000 mg | ORAL_TABLET | Freq: Once | ORAL | Status: AC
  Administered 2013-01-22: 5 mg via ORAL
  Filled 2013-01-22: qty 1

## 2013-01-22 MED ORDER — IBUPROFEN 400 MG PO TABS
400.0000 mg | ORAL_TABLET | Freq: Once | ORAL | Status: AC
  Administered 2013-01-22: 400 mg via ORAL
  Filled 2013-01-22: qty 1

## 2013-01-22 NOTE — ED Notes (Signed)
Pt reports back pain since Sunday, h/o back problems, sneezed and since that time, feels like back is locking. Tried ibu, at home and not helping.

## 2013-01-22 NOTE — ED Provider Notes (Signed)
History     CSN: [id number]  Arrival date & time 01/22/13  1248   First MD Initiated Contact with Patient 01/22/13 1338      Chief Complaint  Patient presents with  . Back Pain    (Consider location/radiation/quality/duration/timing/severity/associated sxs/prior treatment) Patient is a 42 y.o. female presenting with back pain. The history is provided by the patient.  Back Pain Location:  Lumbar spine Quality:  Aching and stiffness Stiffness is present:  All day Pain severity:  Moderate Pain is:  Same all the time Onset quality:  Gradual Timing:  Intermittent Progression:  Worsening Chronicity:  Chronic Context comment:  Pt has a hx of chronic back pain. She states she sneezed and has had sensation of cramping and back locking up. Relieved by:  Nothing Worsened by:  Movement and sneezing Ineffective treatments:  NSAIDs Associated symptoms: headaches   Associated symptoms: no abdominal pain, no bladder incontinence, no bowel incontinence, no chest pain, no dysuria and no perianal numbness   Risk factors: no recent surgery     Past Medical History  Diagnosis Date  . Anxiety   . Depression   . Back injury   . Chronic back pain   . Migraine headache     Past Surgical History  Procedure Laterality Date  . Tubal ligation    . Eye surgery      Family History  Problem Relation Age of Onset  . Diabetes Other   . Heart failure Other     History  Substance Use Topics  . Smoking status: Never Smoker   . Smokeless tobacco: Never Used  . Alcohol Use: No    OB History   Grav Para Term Preterm Abortions TAB SAB Ect Mult Living   4 4 4       4       Review of Systems  Constitutional: Negative for activity change.       All ROS Neg except as noted in HPI  HENT: Negative for nosebleeds and neck pain.   Eyes: Negative for photophobia and discharge.  Respiratory: Negative for cough, shortness of breath and wheezing.   Cardiovascular: Negative for chest pain and  palpitations.  Gastrointestinal: Negative for abdominal pain, blood in stool and bowel incontinence.  Genitourinary: Negative for bladder incontinence, dysuria, frequency and hematuria.  Musculoskeletal: Positive for back pain. Negative for arthralgias.  Skin: Negative.   Neurological: Positive for headaches. Negative for dizziness, seizures and speech difficulty.  Psychiatric/Behavioral: Negative for hallucinations and confusion. The patient is nervous/anxious.     Allergies  Cortisone base; Ketorolac tromethamine; Sulfa antibiotics; and Ultram  Home Medications   Current Outpatient Rx  Name  Route  Sig  Dispense  Refill  . citalopram (CELEXA) 20 MG tablet   Oral   Take 20 mg by mouth every morning.          . diazepam (VALIUM) 10 MG tablet   Oral   Take 10 mg by mouth 3 (three) times daily.         Marland Kitchen ibuprofen (ADVIL,MOTRIN) 800 MG tablet   Oral   Take 800 mg by mouth every 8 (eight) hours as needed for pain.         Marland Kitchen acetaminophen-codeine (TYLENOL #3) 300-30 MG per tablet   Oral   Take 1-2 tablets by mouth every 6 (six) hours as needed.   20 tablet   0     Please take with food   . diazepam (VALIUM) 5 MG tablet  Oral   Take 1 tablet (5 mg total) by mouth 3 (three) times daily.   10 tablet   0     BP 129/77  Pulse 84  Temp(Src) 98.3 F (36.8 C) (Oral)  Resp 18  Ht 5\' 7"  (1.702 m)  Wt 200 lb (90.719 kg)  BMI 31.32 kg/m2  SpO2 99%  LMP 01/03/2013  Physical Exam  Nursing note and vitals reviewed. Constitutional: She is oriented to person, place, and time. She appears well-developed and well-nourished.  Non-toxic appearance.  HENT:  Head: Normocephalic.  Right Ear: Tympanic membrane and external ear normal.  Left Ear: Tympanic membrane and external ear normal.  Eyes: EOM and lids are normal. Pupils are equal, round, and reactive to light.  Neck: Normal range of motion. Neck supple. Carotid bruit is not present.  Cardiovascular: Normal rate,  regular rhythm, normal heart sounds, intact distal pulses and normal pulses.   Pulmonary/Chest: Breath sounds normal. No respiratory distress.  Abdominal: Soft. Bowel sounds are normal. There is no tenderness. There is no guarding.  Musculoskeletal: Normal range of motion.  There is pain to palpation of the lumbar area. There is paraspinal area tenderness and some spasm. There no hot areas of the lumbar region. There is no palpable step off.  Lymphadenopathy:       Head (right side): No submandibular adenopathy present.       Head (left side): No submandibular adenopathy present.    She has no cervical adenopathy.  Neurological: She is alert and oriented to person, place, and time. She has normal strength. No cranial nerve deficit or sensory deficit.  Gait is slow. No unusual weakness of the extremities. No foot drop noted. No sensory deficits of the lower extremities appreciated.  Skin: Skin is warm and dry.  Psychiatric: She has a normal mood and affect. Her speech is normal.    ED Course  Procedures (including critical care time) Pulse Ox 99% on room air. WNL by my interpretation. Labs Reviewed - No data to display No results found.   1. Low back pain       MDM  I have reviewed nursing notes, vital signs, and all appropriate lab and imaging results for this patient. Pt has a hx of chronic back pain. She reports more pain and cramping after a sneeze. No gross neuro deficit noted on exam. Vital signs stable. Plan - Pt will be treated with tylenol #3,  And valium for spasm pain. Pt to follow up with orthopedics for additonal evaluation and management.       Kathie Dike, PA-C 01/24/13 [phone]

## 2013-01-25 NOTE — ED Provider Notes (Signed)
Medical screening examination/treatment/procedure(s) were performed by non-physician practitioner and as supervising physician I was immediately available for consultation/collaboration.   Jazzie Trampe M Javel Hersh, DO 01/25/13 1100 

## 2013-03-04 ENCOUNTER — Encounter (HOSPITAL_COMMUNITY): Payer: Self-pay

## 2013-03-04 ENCOUNTER — Emergency Department (HOSPITAL_COMMUNITY)
Admission: EM | Admit: 2013-03-04 | Discharge: 2013-03-04 | Disposition: A | Discharge status: Home / Self Care | Payer: Self-pay | Attending: Emergency Medicine | Admitting: Emergency Medicine

## 2013-03-04 DIAGNOSIS — R509 Fever, unspecified: Secondary | ICD-10-CM | POA: Insufficient documentation

## 2013-03-04 DIAGNOSIS — K029 Dental caries, unspecified: Secondary | ICD-10-CM | POA: Insufficient documentation

## 2013-03-04 DIAGNOSIS — Z87828 Personal history of other (healed) physical injury and trauma: Secondary | ICD-10-CM | POA: Insufficient documentation

## 2013-03-04 DIAGNOSIS — Z8679 Personal history of other diseases of the circulatory system: Secondary | ICD-10-CM | POA: Insufficient documentation

## 2013-03-04 DIAGNOSIS — R51 Headache: Secondary | ICD-10-CM | POA: Insufficient documentation

## 2013-03-04 DIAGNOSIS — R220 Localized swelling, mass and lump, head: Secondary | ICD-10-CM | POA: Insufficient documentation

## 2013-03-04 DIAGNOSIS — Z8659 Personal history of other mental and behavioral disorders: Secondary | ICD-10-CM | POA: Insufficient documentation

## 2013-03-04 DIAGNOSIS — Z79899 Other long term (current) drug therapy: Secondary | ICD-10-CM | POA: Insufficient documentation

## 2013-03-04 DIAGNOSIS — F411 Generalized anxiety disorder: Secondary | ICD-10-CM | POA: Insufficient documentation

## 2013-03-04 MED ORDER — HYDROCODONE-ACETAMINOPHEN 5-325 MG PO TABS: 1.0000 | ORAL_TABLET | ORAL | Status: DC | PRN

## 2013-03-04 MED ORDER — AMOXICILLIN 500 MG PO CAPS: 500.0000 mg | ORAL_CAPSULE | Freq: Three times a day (TID) | ORAL | Status: DC

## 2013-03-04 MED ORDER — HYDROCODONE-ACETAMINOPHEN 5-325 MG PO TABS
1.0000 | ORAL_TABLET | Freq: Once | ORAL | Status: AC
  Administered 2013-03-04: 1 via ORAL
  Filled 2013-03-04: qty 1

## 2013-03-04 NOTE — ED Notes (Signed)
Patient requesting something for pain before she being discharged. EDPa made aware, verbal order given.

## 2013-03-04 NOTE — ED Notes (Signed)
Patient requesting to have prescriptions on separate papers so that she can go to [facility] and fill antibiotic for free because she does not have the money to fill both at once.

## 2013-03-04 NOTE — ED Provider Notes (Signed)
History    CSN: [id number] Arrival date & time 03/04/13  1217  First MD Initiated Contact with Patient 03/04/13 1240     Chief Complaint  Patient presents with  . Dental Pain   (Consider location/radiation/quality/duration/timing/severity/associated sxs/prior Treatment) Patient is a 42 y.o. female presenting with tooth pain. The history is provided by the patient.  Dental Pain Location:  Generalized Quality:  Throbbing Severity:  Moderate Onset quality:  Gradual Duration:  3 months Timing:  Intermittent Progression:  Worsening Chronicity:  Recurrent Context: dental caries and poor dentition   Relieved by:  Nothing Worsened by:  Nothing tried Ineffective treatments:  None tried Associated symptoms: fever, gum swelling and headaches   Associated symptoms: no difficulty swallowing and no neck pain    Past Medical History  Diagnosis Date  . Anxiety   . Depression   . Back injury   . Chronic back pain   . Migraine headache    Past Surgical History  Procedure Laterality Date  . Tubal ligation    . Eye surgery     Family History  Problem Relation Age of Onset  . Diabetes Other   . Heart failure Other    History  Substance Use Topics  . Smoking status: Never Smoker   . Smokeless tobacco: Never Used  . Alcohol Use: No   OB History   Grav Para Term Preterm Abortions TAB SAB Ect Mult Living   4 4 4       4      Review of Systems  Constitutional: Positive for fever. Negative for activity change.       All ROS Neg except as noted in HPI  HENT: Positive for dental problem. Negative for nosebleeds and neck pain.   Eyes: Negative for photophobia and discharge.  Respiratory: Negative for cough, shortness of breath and wheezing.   Cardiovascular: Negative for chest pain and palpitations.  Gastrointestinal: Negative for abdominal pain and blood in stool.  Genitourinary: Negative for dysuria, frequency and hematuria.  Musculoskeletal: Negative for back pain and  arthralgias.  Skin: Negative.   Neurological: Positive for headaches. Negative for dizziness, seizures and speech difficulty.  Psychiatric/Behavioral: Negative for hallucinations and confusion.    Allergies  Cortisone base; Ketorolac tromethamine; Sulfa antibiotics; Ultram; and Ibuprofen  Home Medications   Current Outpatient Rx  Name  Route  Sig  Dispense  Refill  . diazepam (VALIUM) 10 MG tablet   Oral   Take 10 mg by mouth 3 (three) times daily.          BP 129/84  Pulse 71  Temp(Src) 98.7 F (37.1 C) (Oral)  Resp 20  Ht 5\' 7"  (1.702 m)  Wt 200 lb (90.719 kg)  BMI 31.32 kg/m2  SpO2 100%  LMP 02/02/2013 Physical Exam  Nursing note and vitals reviewed. Constitutional: She is oriented to person, place, and time. She appears well-developed and well-nourished.  Non-toxic appearance.  HENT:  Head: Normocephalic.  Right Ear: Tympanic membrane and external ear normal.  Left Ear: Tympanic membrane and external ear normal.  Multiple dental caries appreciated of the upper and lower gums. There is significant swelling of the upper gums right and left. No visible abscess appreciated. Is no swelling under the tongue. The airway is patent.  Eyes: EOM and lids are normal. Pupils are equal, round, and reactive to light.  Neck: Normal range of motion. Neck supple. Carotid bruit is not present.  Cardiovascular: Normal rate, regular rhythm, normal heart sounds, intact distal pulses and  normal pulses.   Pulmonary/Chest: Breath sounds normal. No respiratory distress.  Abdominal: Soft. Bowel sounds are normal. There is no tenderness. There is no guarding.  Musculoskeletal: Normal range of motion.  Lymphadenopathy:       Head (right side): No submandibular adenopathy present.       Head (left side): No submandibular adenopathy present.    She has no cervical adenopathy.  Neurological: She is alert and oriented to person, place, and time. She has normal strength. No cranial nerve deficit or  sensory deficit.  Skin: Skin is warm and dry.  Psychiatric: She has a normal mood and affect. Her speech is normal.    ED Course  Procedures (including critical care time) Labs Reviewed - No data to display No results found. No diagnosis found.  MDM  I have reviewed nursing notes, vital signs, and all appropriate lab and imaging results for this patient.  Patient advised to see a dentist as sone as possible because of her multiple dental caries. Patient is treated with Amoxil and Norco. It is also suggested to the patient that the dental clinic at the [facility] maybe helpful to her.  Kathie Dike, PA-C 03/04/13 1328

## 2013-03-04 NOTE — ED Provider Notes (Signed)
Medical screening examination/treatment/procedure(s) were performed by non-physician practitioner and as supervising physician I was immediately available for consultation/collaboration.   Shelda Jakes, MD 03/04/13 [phone]

## 2013-03-04 NOTE — ED Notes (Signed)
Pt c/o dental pain for the past few days

## 2013-05-15 ENCOUNTER — Emergency Department (HOSPITAL_COMMUNITY): Payer: Self-pay

## 2013-05-15 ENCOUNTER — Encounter (HOSPITAL_COMMUNITY): Payer: Self-pay | Admitting: Emergency Medicine

## 2013-05-15 ENCOUNTER — Emergency Department (HOSPITAL_COMMUNITY)
Admission: EM | Admit: 2013-05-15 | Discharge: 2013-05-15 | Disposition: A | Discharge status: Home / Self Care | Payer: Self-pay | Attending: Emergency Medicine | Admitting: Emergency Medicine

## 2013-05-15 ENCOUNTER — Other Ambulatory Visit: Payer: Self-pay

## 2013-05-15 DIAGNOSIS — G8929 Other chronic pain: Secondary | ICD-10-CM | POA: Insufficient documentation

## 2013-05-15 DIAGNOSIS — R071 Chest pain on breathing: Secondary | ICD-10-CM | POA: Insufficient documentation

## 2013-05-15 DIAGNOSIS — Z8679 Personal history of other diseases of the circulatory system: Secondary | ICD-10-CM | POA: Insufficient documentation

## 2013-05-15 DIAGNOSIS — F411 Generalized anxiety disorder: Secondary | ICD-10-CM | POA: Insufficient documentation

## 2013-05-15 DIAGNOSIS — Z87828 Personal history of other (healed) physical injury and trauma: Secondary | ICD-10-CM | POA: Insufficient documentation

## 2013-05-15 DIAGNOSIS — R0789 Other chest pain: Secondary | ICD-10-CM

## 2013-05-15 DIAGNOSIS — K8020 Calculus of gallbladder without cholecystitis without obstruction: Secondary | ICD-10-CM | POA: Insufficient documentation

## 2013-05-15 DIAGNOSIS — Z3202 Encounter for pregnancy test, result negative: Secondary | ICD-10-CM | POA: Insufficient documentation

## 2013-05-15 DIAGNOSIS — R112 Nausea with vomiting, unspecified: Secondary | ICD-10-CM | POA: Insufficient documentation

## 2013-05-15 DIAGNOSIS — Z79899 Other long term (current) drug therapy: Secondary | ICD-10-CM | POA: Insufficient documentation

## 2013-05-15 HISTORY — DX: Other chronic pain: G89.29

## 2013-05-15 HISTORY — DX: Disorder of teeth and supporting structures, unspecified: K08.9

## 2013-05-15 LAB — D-DIMER, QUANTITATIVE: D-Dimer, Quant: <0.27 ug/mL-FEU (ref 0.00–0.48)

## 2013-05-15 LAB — CBC WITH DIFFERENTIAL/PLATELET
Basophils Relative: 0 % (ref 0–1)
HCT: 39.5 % (ref 36.0–46.0)
Hemoglobin: 13.6 g/dL (ref 12.0–15.0)
Lymphocytes Relative: 16 % (ref 12–46)
Lymphs Abs: 1.5 10*3/uL (ref 0.7–4.0)
Monocytes Absolute: 0.4 10*3/uL (ref 0.1–1.0)
Monocytes Relative: 4 % (ref 3–12)
Neutro Abs: 7.6 10*3/uL (ref 1.7–7.7)
Neutrophils Relative %: 80 % — ABNORMAL HIGH (ref 43–77)
RBC: 4.48 MIL/uL (ref 3.87–5.11)
WBC: 9.6 10*3/uL (ref 4.0–10.5)

## 2013-05-15 LAB — COMPREHENSIVE METABOLIC PANEL
Albumin: 3.8 g/dL (ref 3.5–5.2)
Alkaline Phosphatase: 86 U/L (ref 39–117)
BUN: 12 mg/dL (ref 6–23)
Calcium: 9.6 mg/dL (ref 8.4–10.5)
GFR calc Af Amer: >90 mL/min (ref >90)
Glucose, Bld: 109 mg/dL — ABNORMAL HIGH (ref 70–99)
Potassium: 3.9 mEq/L (ref 3.5–5.1)
Sodium: 137 mEq/L (ref 135–145)
Total Protein: 7.7 g/dL (ref 6.0–8.3)

## 2013-05-15 LAB — URINALYSIS, ROUTINE W REFLEX MICROSCOPIC
Glucose, UA: NEGATIVE mg/dL
Hgb urine dipstick: NEGATIVE
Leukocytes, UA: NEGATIVE
Protein, ur: NEGATIVE mg/dL
Specific Gravity, Urine: 1.015 (ref 1.005–1.030)
Urobilinogen, UA: 0.2 mg/dL (ref 0.0–1.0)

## 2013-05-15 LAB — LIPASE, BLOOD: Lipase: 77 U/L — ABNORMAL HIGH (ref 11–59)

## 2013-05-15 LAB — URINE MICROSCOPIC-ADD ON

## 2013-05-15 MED ORDER — PROMETHAZINE HCL 25 MG/ML IJ SOLN
12.5000 mg | Freq: Once | INTRAMUSCULAR | Status: AC
  Administered 2013-05-15: 12.5 mg via INTRAVENOUS
  Filled 2013-05-15: qty 1

## 2013-05-15 MED ORDER — FENTANYL CITRATE 0.05 MG/ML IJ SOLN
50.0000 ug | INTRAMUSCULAR | Status: AC | PRN
  Administered 2013-05-15 (×2): 50 ug via INTRAVENOUS
  Filled 2013-05-15 (×2): qty 2

## 2013-05-15 MED ORDER — OXYCODONE-ACETAMINOPHEN 5-325 MG PO TABS
2.0000 | ORAL_TABLET | Freq: Once | ORAL | Status: DC
  Filled 2013-05-15: qty 2

## 2013-05-15 MED ORDER — OXYCODONE-ACETAMINOPHEN 5-325 MG PO TABS: ORAL_TABLET | ORAL | Status: DC

## 2013-05-15 MED ORDER — PROMETHAZINE HCL 25 MG PO TABS: 25.0000 mg | ORAL_TABLET | Freq: Four times a day (QID) | ORAL | Status: DC | PRN

## 2013-05-15 MED ORDER — DIAZEPAM 5 MG PO TABS
5.0000 mg | ORAL_TABLET | Freq: Once | ORAL | Status: DC
  Filled 2013-05-15: qty 1

## 2013-05-15 MED ORDER — ONDANSETRON 8 MG PO TBDP
8.0000 mg | ORAL_TABLET | Freq: Once | ORAL | Status: AC
  Administered 2013-05-15: 8 mg via ORAL
  Filled 2013-05-15: qty 1

## 2013-05-15 NOTE — ED Provider Notes (Signed)
CSN: [id number]     Arrival date & time 05/15/13  1007 History   First MD Initiated Contact with Patient 05/15/13 1024     Chief Complaint  Patient presents with  . Chest Pain  . Emesis    HPI Pt was seen at 1040. Per pt, c/o gradual onset and persistence of constant right sided chest wall "pain" since this morning. Pt states the pain worsens with palpation of the area, deep breath and certain body positions. Pt also c/o several intermittent episodes of N/V since this morning.  Denies diarrhea, no abd pain, no back pain, no palpitations, no cough, no fevers, no rash, no black or blood in stools or emesis.    Past Medical History  Diagnosis Date  . Anxiety   . Depression   . Back injury   . Chronic back pain   . Migraine headache   . Chronic dental pain    Past Surgical History  Procedure Laterality Date  . Tubal ligation    . Eye surgery     Family History  Problem Relation Age of Onset  . Diabetes Other   . Heart failure Other    History  Substance Use Topics  . Smoking status: Never Smoker   . Smokeless tobacco: Never Used  . Alcohol Use: No   OB History   Grav Para Term Preterm Abortions TAB SAB Ect Mult Living   4 4 4       4      Review of Systems ROS: Statement: All systems negative except as marked or noted in the HPI; Constitutional: Negative for fever and chills. ; ; Eyes: Negative for eye pain, redness and discharge. ; ; ENMT: Negative for ear pain, hoarseness, nasal congestion, sinus pressure and sore throat. ; ; Cardiovascular: +CP. Negative for palpitations, diaphoresis, dyspnea and peripheral edema. ; ; Respiratory: Negative for cough, wheezing and stridor. ; ; Gastrointestinal: +N/V. Negative for diarrhea, abdominal pain, blood in stool, hematemesis, jaundice and rectal bleeding. . ; ; Genitourinary: Negative for dysuria, flank pain and hematuria. ; ; Musculoskeletal: Negative for back pain and neck pain. Negative for swelling and trauma.; ; Skin: Negative for  pruritus, rash, abrasions, blisters, bruising and skin lesion.; ; Neuro: Negative for headache, lightheadedness and neck stiffness. Negative for weakness, altered level of consciousness , altered mental status, extremity weakness, paresthesias, involuntary movement, seizure and syncope.       Allergies  Cortisone base; Ketorolac tromethamine; Sulfa antibiotics; Ultram; and Ibuprofen  Home Medications   Current Outpatient Rx  Name  Route  Sig  Dispense  Refill  . diazepam (VALIUM) 10 MG tablet   Oral   Take 10 mg by mouth 3 (three) times daily.         Marland Kitchen oxyCODONE-acetaminophen (PERCOCET/ROXICET) 5-325 MG per tablet      1 or 2 tabs PO q6h prn pain   25 tablet   0   . promethazine (PHENERGAN) 25 MG tablet   Oral   Take 1 tablet (25 mg total) by mouth every 6 (six) hours as needed for nausea.   10 tablet   0    BP 144/59  Pulse 74  Temp(Src) 98.6 F (37 C) (Oral)  Resp 13  Ht 5\' 7"  (1.702 m)  Wt 215 lb (97.523 kg)  BMI 33.67 kg/m2  SpO2 94%  LMP 05/04/2013 Physical Exam 1045: Physical examination:  Nursing notes reviewed; Vital signs and O2 SAT reviewed;  Constitutional: Well developed, Well nourished, Well hydrated, In  no acute distress; Head:  Normocephalic, atraumatic; Eyes: EOMI, PERRL, No scleral icterus; ENMT: Mouth and pharynx normal, Mucous membranes moist; Neck: Supple, Full range of motion, No lymphadenopathy; Cardiovascular: Regular rate and rhythm, No murmur, rub, or gallop; Respiratory: Breath sounds clear & equal bilaterally, No rales, rhonchi, wheezes.  Speaking full sentences with ease, Normal respiratory effort/excursion; Chest: +TTP right parasternal and anterior chest wall areas which reproduces pt's pain. No rash. Movement normal; Abdomen: Soft, Nontender, Nondistended, Normal bowel sounds; Genitourinary: No CVA tenderness; Extremities: Pulses normal, No tenderness, No edema, No calf edema or asymmetry.; Neuro: AA&Ox3, Major CN grossly intact.  Speech  clear. Climbs on and off stretcher by herself. Gait steady. No gross focal motor or sensory deficits in extremities.; Skin: Color normal, Warm, Dry.   ED Course  Procedures     MDM  MDM Reviewed: previous chart, nursing note and vitals Reviewed previous: labs and ECG Interpretation: labs, ultrasound, x-ray and ECG    Date: 05/15/2013  Rate: 79  Rhythm: normal sinus rhythm  QRS Axis: normal  Intervals: normal  ST/T Wave abnormalities: normal  Conduction Disutrbances:none  Narrative Interpretation:   Old EKG Reviewed: unchanged; no significant changes from previous EKG dated 02/06/2011.  Results for orders placed during the hospital encounter of 05/15/13  PREGNANCY, URINE      Result Value Range   Preg Test, Ur NEGATIVE  NEGATIVE  URINALYSIS, ROUTINE W REFLEX MICROSCOPIC      Result Value Range   Color, Urine YELLOW  YELLOW   APPearance CLOUDY (*) CLEAR   Specific Gravity, Urine 1.015  1.005 - 1.030   pH 8.5 (*) 5.0 - 8.0   Glucose, UA NEGATIVE  NEGATIVE mg/dL   Hgb urine dipstick NEGATIVE  NEGATIVE   Bilirubin Urine NEGATIVE  NEGATIVE   Ketones, ur NEGATIVE  NEGATIVE mg/dL   Protein, ur NEGATIVE  NEGATIVE mg/dL   Urobilinogen, UA 0.2  0.0 - 1.0 mg/dL   Nitrite POSITIVE (*) NEGATIVE   Leukocytes, UA NEGATIVE  NEGATIVE  COMPREHENSIVE METABOLIC PANEL      Result Value Range   Sodium 137  135 - 145 mEq/L   Potassium 3.9  3.5 - 5.1 mEq/L   Chloride 102  96 - 112 mEq/L   CO2 26  19 - 32 mEq/L   Glucose, Bld 109 (*) 70 - 99 mg/dL   BUN 12  6 - 23 mg/dL   Creatinine, Ser 4.09  0.50 - 1.10 mg/dL   Calcium 9.6  8.4 - 81.1 mg/dL   Total Protein 7.7  6.0 - 8.3 g/dL   Albumin 3.8  3.5 - 5.2 g/dL   AST 90 (*) 0 - 37 U/L   ALT 39 (*) 0 - 35 U/L   Alkaline Phosphatase 86  39 - 117 U/L   Total Bilirubin 0.7  0.3 - 1.2 mg/dL   GFR calc non Af Amer 86 (*) >90 mL/min   GFR calc Af Amer >90  >90 mL/min  LIPASE, BLOOD      Result Value Range   Lipase 77 (*) 11 - 59 U/L  CBC  WITH DIFFERENTIAL      Result Value Range   WBC 9.6  4.0 - 10.5 K/uL   RBC 4.48  3.87 - 5.11 MIL/uL   Hemoglobin 13.6  12.0 - 15.0 g/dL   HCT 91.4  78.2 - 95.6 %   MCV 88.2  78.0 - 100.0 fL   MCH 30.4  26.0 - 34.0 pg   MCHC 34.4  30.0 - 36.0 g/dL   RDW 16.1  09.6 - 04.5 %   Platelets 273  150 - 400 K/uL   Neutrophils Relative % 80 (*) 43 - 77 %   Neutro Abs 7.6  1.7 - 7.7 K/uL   Lymphocytes Relative 16  12 - 46 %   Lymphs Abs 1.5  0.7 - 4.0 K/uL   Monocytes Relative 4  3 - 12 %   Monocytes Absolute 0.4  0.1 - 1.0 K/uL   Eosinophils Relative 0  0 - 5 %   Eosinophils Absolute 0.0  0.0 - 0.7 K/uL   Basophils Relative 0  0 - 1 %   Basophils Absolute 0.0  0.0 - 0.1 K/uL  TROPONIN I      Result Value Range   Troponin I <0.30  <0.30 ng/mL  D-DIMER, QUANTITATIVE      Result Value Range   D-Dimer, Quant <0.27  0.00 - 0.48 ug/mL-FEU  URINE MICROSCOPIC-ADD ON      Result Value Range   WBC, UA 0-2  <3 WBC/hpf   RBC / HPF 0-2  <3 RBC/hpf   Bacteria, UA MANY (*) RARE   Dg Chest 2 View 05/15/2013   *RADIOLOGY REPORT*  Clinical Data: chest pain  CHEST - 2 VIEW  Comparison: 02/06/2011  Findings: The heart and pulmonary vascularity are within normal limits.  The lungs are clear bilaterally.  No acute bony abnormality is noted.  IMPRESSION: No acute abnormalities seen.   Original Report Authenticated By: Alcide Clever, M.D.   US Abdomen Limited Ruq 05/15/2013   *RADIOLOGY REPORT*  Ultrasound right upper quadrant  History:  Upper abdominal pain with nausea and vomiting  Comparison:  None  Findings:  Gallbladder is visualized in multiple projections. Within the gallbladder, there is a 7 mm echogenic focus which moves and shadows consistent with cholelithiasis.  Gallbladder wall is thickened to just under 4 mm.  There is no pericholecystic fluid. By report, the patient is not tender over the gallbladder.  There is no intrahepatic or extrahepatic biliary duct dilatation.  Liver is enlarged, measuring 16.8 cm  in length.  No focal liver lesions are identified on this study.  Flow in the portal vein is in the anatomic direction.  Conclusion: Cholelithiasis with thickening of the gallbladder wall. Question early cholecystitis.  Note that there is no pericholecystic fluid, however.  No biliary duct dilatation.  Liver enlarged but otherwise appears within normal limits.   Original Report Authenticated By: Bretta Bang, M.D.      1330:   Pt has tol PO well while in the ED without N/V. Abd remains benign, VSS. Pt states she feels better and wants to go home now. LFT's with hx elevation. Lipase non-specifically elevated. WBC count normal. No clear UTI on Udip; UC pending. T/C to General Surgeon Dr. Lovell Sheehan, case discussed, including:  HPI, pertinent PM/SHx, VS/PE, dx testing, ED course and treatment:  No acute surgical issue at this time, agreeable to have pt f/u in his ofc re: elective cholecystectomy. Pt agreeable with this plan. Dx and testing d/w pt and family.  Questions answered.  Verb understanding, agreeable to d/c home with outpt f/u.    Laray Anger, DO 05/18/13 1210

## 2013-05-15 NOTE — ED Notes (Signed)
Patient complaining of central chest pain that radiates to right side. States started at 0700 this morning. Also reports emesis. States pain is worse with deep inspiration and movement.

## 2013-05-15 NOTE — ED Notes (Signed)
Pt requested her pain meds to be given IV, pt states everything she takes by mouth causes her vomit, EDP made aware, new orders received

## 2013-05-15 NOTE — ED Notes (Signed)
Pt in xray

## 2013-05-21 ENCOUNTER — Emergency Department (HOSPITAL_COMMUNITY): Payer: Self-pay

## 2013-05-21 ENCOUNTER — Emergency Department (HOSPITAL_COMMUNITY)
Admission: EM | Admit: 2013-05-21 | Discharge: 2013-05-21 | Disposition: A | Discharge status: Home / Self Care | Payer: Self-pay | Attending: Emergency Medicine | Admitting: Emergency Medicine

## 2013-05-21 ENCOUNTER — Encounter (HOSPITAL_COMMUNITY): Payer: Self-pay

## 2013-05-21 DIAGNOSIS — Z3202 Encounter for pregnancy test, result negative: Secondary | ICD-10-CM | POA: Insufficient documentation

## 2013-05-21 DIAGNOSIS — N83202 Unspecified ovarian cyst, left side: Secondary | ICD-10-CM

## 2013-05-21 DIAGNOSIS — Z8679 Personal history of other diseases of the circulatory system: Secondary | ICD-10-CM | POA: Insufficient documentation

## 2013-05-21 DIAGNOSIS — F411 Generalized anxiety disorder: Secondary | ICD-10-CM | POA: Insufficient documentation

## 2013-05-21 DIAGNOSIS — R110 Nausea: Secondary | ICD-10-CM | POA: Insufficient documentation

## 2013-05-21 DIAGNOSIS — R1011 Right upper quadrant pain: Secondary | ICD-10-CM | POA: Insufficient documentation

## 2013-05-21 DIAGNOSIS — Z87828 Personal history of other (healed) physical injury and trauma: Secondary | ICD-10-CM | POA: Insufficient documentation

## 2013-05-21 DIAGNOSIS — N83209 Unspecified ovarian cyst, unspecified side: Secondary | ICD-10-CM | POA: Insufficient documentation

## 2013-05-21 DIAGNOSIS — Z79899 Other long term (current) drug therapy: Secondary | ICD-10-CM | POA: Insufficient documentation

## 2013-05-21 LAB — CBC WITH DIFFERENTIAL/PLATELET
Basophils Relative: 0 % (ref 0–1)
Eosinophils Absolute: 0.1 10*3/uL (ref 0.0–0.7)
Lymphs Abs: 2 10*3/uL (ref 0.7–4.0)
MCH: 30.6 pg (ref 26.0–34.0)
MCHC: 34.5 g/dL (ref 30.0–36.0)
Monocytes Absolute: 0.5 10*3/uL (ref 0.1–1.0)
Monocytes Relative: 5 % (ref 3–12)
Neutro Abs: 6.5 10*3/uL (ref 1.7–7.7)
RBC: 4.71 MIL/uL (ref 3.87–5.11)
WBC: 9 10*3/uL (ref 4.0–10.5)

## 2013-05-21 LAB — COMPREHENSIVE METABOLIC PANEL
Albumin: 4.1 g/dL (ref 3.5–5.2)
BUN: 7 mg/dL (ref 6–23)
Chloride: 103 mEq/L (ref 96–112)
Creatinine, Ser: 0.77 mg/dL (ref 0.50–1.10)
GFR calc Af Amer: >90 mL/min (ref >90)
Glucose, Bld: 91 mg/dL (ref 70–99)
Total Bilirubin: 0.2 mg/dL — ABNORMAL LOW (ref 0.3–1.2)

## 2013-05-21 LAB — URINALYSIS, ROUTINE W REFLEX MICROSCOPIC
Ketones, ur: NEGATIVE mg/dL
Leukocytes, UA: NEGATIVE
Nitrite: NEGATIVE
Protein, ur: NEGATIVE mg/dL
Urobilinogen, UA: 0.2 mg/dL (ref 0.0–1.0)
pH: 8 (ref 5.0–8.0)

## 2013-05-21 LAB — LIPASE, BLOOD: Lipase: 34 U/L (ref 11–59)

## 2013-05-21 MED ORDER — SODIUM CHLORIDE 0.9 % IV SOLN
INTRAVENOUS | Status: DC
  Administered 2013-05-21: 17:00:00 via INTRAVENOUS

## 2013-05-21 MED ORDER — HYDROCODONE-ACETAMINOPHEN 5-325 MG PO TABS: ORAL_TABLET | ORAL | Status: DC

## 2013-05-21 MED ORDER — IOHEXOL 300 MG/ML  SOLN
100.0000 mL | Freq: Once | INTRAMUSCULAR | Status: AC | PRN
  Administered 2013-05-21: 100 mL via INTRAVENOUS

## 2013-05-21 MED ORDER — FENTANYL CITRATE 0.05 MG/ML IJ SOLN
50.0000 ug | INTRAMUSCULAR | Status: DC | PRN
  Administered 2013-05-21: 50 ug via INTRAVENOUS
  Filled 2013-05-21: qty 2

## 2013-05-21 MED ORDER — IOHEXOL 300 MG/ML  SOLN
50.0000 mL | Freq: Once | INTRAMUSCULAR | Status: AC | PRN
  Administered 2013-05-21: 50 mL via ORAL

## 2013-05-21 MED ORDER — PROMETHAZINE HCL 25 MG/ML IJ SOLN
12.5000 mg | Freq: Once | INTRAMUSCULAR | Status: AC
  Administered 2013-05-21: 12.5 mg via INTRAVENOUS
  Filled 2013-05-21: qty 1

## 2013-05-21 NOTE — ED Provider Notes (Signed)
CSN: [id number]     Arrival date & time 05/21/13  1231 History   First MD Initiated Contact with Patient 05/21/13 1617     Chief Complaint  Patient presents with  . Abdominal Pain    HPI Pt was seen at 1710.   Per pt, c/o gradual onset and persistence of constant RUQ abd "pain" for the past 1 week.  Has been associated with nausea. States the pain radiates into her right lower back then radiates into the right side of her torso. States she was evaluated in the ED last week for same, but did not f/u with the General Surgeon as instructed.  Denies vomiting/diarrhea, no pelvic pain, no fevers, no back pain, no rash, no CP/SOB, no black or blood in stools.       Past Medical History  Diagnosis Date  . Anxiety   . Depression   . Back injury   . Chronic back pain   . Migraine headache   . Chronic dental pain    Past Surgical History  Procedure Laterality Date  . Tubal ligation    . Eye surgery     Family History  Problem Relation Age of Onset  . Diabetes Other   . Heart failure Other    History  Substance Use Topics  . Smoking status: Never Smoker   . Smokeless tobacco: Never Used  . Alcohol Use: No   OB History   Grav Para Term Preterm Abortions TAB SAB Ect Mult Living   4 4 4       4      Review of Systems ROS: Statement: All systems negative except as marked or noted in the HPI; Constitutional: Negative for fever and chills. ; ; Eyes: Negative for eye pain, redness and discharge. ; ; ENMT: Negative for ear pain, hoarseness, nasal congestion, sinus pressure and sore throat. ; ; Cardiovascular: Negative for chest pain, palpitations, diaphoresis, dyspnea and peripheral edema. ; ; Respiratory: Negative for cough, wheezing and stridor. ; ; Gastrointestinal: +nausea, abd pain. Negative for vomiting, diarrhea, blood in stool, hematemesis, jaundice and rectal bleeding. . ; ; Genitourinary: Negative for dysuria, flank pain and hematuria. ; ; Musculoskeletal: +LBP. Negative for neck  pain. Negative for swelling and trauma.; ; Skin: Negative for pruritus, rash, abrasions, blisters, bruising and skin lesion.; ; Neuro: Negative for headache, lightheadedness and neck stiffness. Negative for weakness, altered level of consciousness , altered mental status, extremity weakness, paresthesias, involuntary movement, seizure and syncope.     Allergies  Cortisone base; Ketorolac tromethamine; Sulfa antibiotics; Ultram; and Ibuprofen  Home Medications   Current Outpatient Rx  Name  Route  Sig  Dispense  Refill  . diazepam (VALIUM) 10 MG tablet   Oral   Take 10 mg by mouth 3 (three) times daily.          BP 113/66  Pulse 70  Temp(Src) 98.3 F (36.8 C) (Oral)  Resp 20  Ht 5\' 7"  (1.702 m)  Wt 215 lb (97.523 kg)  BMI 33.67 kg/m2  SpO2 98%  LMP 05/04/2013 Physical Exam 1715: Physical examination:  Nursing notes reviewed; Vital signs and O2 SAT reviewed;  Constitutional: Well developed, Well nourished, Well hydrated, In no acute distress; Head:  Normocephalic, atraumatic; Eyes: EOMI, PERRL, No scleral icterus; ENMT: Mouth and pharynx normal, Mucous membranes moist; Neck: Supple, Full range of motion, No lymphadenopathy; Cardiovascular: Regular rate and rhythm, No murmur, rub, or gallop; Respiratory: Breath sounds clear & equal bilaterally, No rales, rhonchi, wheezes.  Speaking full sentences with ease, Normal respiratory effort/excursion; Chest: Nontender, Movement normal; Abdomen: Soft, Nontender, Nondistended, Normal bowel sounds; Genitourinary: No CVA tenderness; Spine:  No midline CS, TS, LS tenderness.;; Extremities: Pulses normal, No tenderness, No edema, No calf edema or asymmetry.; Neuro: AA&Ox3, Major CN grossly intact.  Speech clear. No gross focal motor or sensory deficits in extremities.; Skin: Color normal, Warm, Dry.   ED Course  Procedures     MDM  MDM Reviewed: previous chart, nursing note and vitals Reviewed previous: labs and ultrasound Interpretation:  labs, CT scan, ultrasound and x-ray   Results for orders placed during the hospital encounter of 05/21/13  CBC WITH DIFFERENTIAL      Result Value Range   WBC 9.0  4.0 - 10.5 K/uL   RBC 4.71  3.87 - 5.11 MIL/uL   Hemoglobin 14.4  12.0 - 15.0 g/dL   HCT 19.1  47.8 - 29.5 %   MCV 88.5  78.0 - 100.0 fL   MCH 30.6  26.0 - 34.0 pg   MCHC 34.5  30.0 - 36.0 g/dL   RDW 62.1  30.8 - 65.7 %   Platelets 244  150 - 400 K/uL   Neutrophils Relative % 72  43 - 77 %   Neutro Abs 6.5  1.7 - 7.7 K/uL   Lymphocytes Relative 22  12 - 46 %   Lymphs Abs 2.0  0.7 - 4.0 K/uL   Monocytes Relative 5  3 - 12 %   Monocytes Absolute 0.5  0.1 - 1.0 K/uL   Eosinophils Relative 1  0 - 5 %   Eosinophils Absolute 0.1  0.0 - 0.7 K/uL   Basophils Relative 0  0 - 1 %   Basophils Absolute 0.0  0.0 - 0.1 K/uL  COMPREHENSIVE METABOLIC PANEL      Result Value Range   Sodium 139  135 - 145 mEq/L   Potassium 4.1  3.5 - 5.1 mEq/L   Chloride 103  96 - 112 mEq/L   CO2 26  19 - 32 mEq/L   Glucose, Bld 91  70 - 99 mg/dL   BUN 7  6 - 23 mg/dL   Creatinine, Ser 8.46  0.50 - 1.10 mg/dL   Calcium 96.2  8.4 - 95.2 mg/dL   Total Protein 8.1  6.0 - 8.3 g/dL   Albumin 4.1  3.5 - 5.2 g/dL   AST 30  0 - 37 U/L   ALT 45 (*) 0 - 35 U/L   Alkaline Phosphatase 96  39 - 117 U/L   Total Bilirubin 0.2 (*) 0.3 - 1.2 mg/dL   GFR calc non Af Amer >90  >90 mL/min   GFR calc Af Amer >90  >90 mL/min  LIPASE, BLOOD      Result Value Range   Lipase 34  11 - 59 U/L  URINALYSIS, ROUTINE W REFLEX MICROSCOPIC      Result Value Range   Color, Urine YELLOW  YELLOW   APPearance CLEAR  CLEAR   Specific Gravity, Urine 1.015  1.005 - 1.030   pH 8.0  5.0 - 8.0   Glucose, UA NEGATIVE  NEGATIVE mg/dL   Hgb urine dipstick NEGATIVE  NEGATIVE   Bilirubin Urine NEGATIVE  NEGATIVE   Ketones, ur NEGATIVE  NEGATIVE mg/dL   Protein, ur NEGATIVE  NEGATIVE mg/dL   Urobilinogen, UA 0.2  0.0 - 1.0 mg/dL   Nitrite NEGATIVE  NEGATIVE   Leukocytes, UA  NEGATIVE  NEGATIVE  PREGNANCY, URINE  Result Value Range   Preg Test, Ur NEGATIVE  NEGATIVE   Dg Chest 2 View 05/21/2013   *RADIOLOGY REPORT*  Clinical Data: Pain  CHEST - 2 VIEW  Comparison:  May 15, 2013  Findings: Lungs clear.  Heart size and pulmonary vascularity are normal.  No adenopathy.  No bone lesions.  IMPRESSION: No abnormality noted.   Original Report Authenticated By: Bretta Bang, M.D.   Ct Abdomen Pelvis W Contrast 05/21/2013   *RADIOLOGY REPORT*  Clinical Data: Abdominal pain with nausea vomiting  CT ABDOMEN AND PELVIS WITH CONTRAST  Technique:  Multidetector CT imaging of the abdomen and pelvis was performed following the standard protocol during bolus administration of intravenous contrast. Oral contrast was also administered.  Contrast: 100 ml Omnipaque-300 nonionic  Comparison: Abdominal ultrasound May 21, 2013  Findings: Lung bases are clear.  Liver is enlarged measuring 19.5 cm in length.  No focal liver lesions are identified.  There is no biliary duct dilatation. There is either a polyp or gallstone in the gallbladder measuring 8 mm in size.  Spleen, pancreas, and adrenals appear normal.  Kidneys bilaterally show no mass or hydronephrosis on either side.  In the pelvis, there is a surgical clip just to the right of the uterus.  There is free fluid in the cul-de-sac.  There is a cystic area with wall enhancement in the left ovary measuring 2.9 x 2.2 cm in size.  There is no other pelvic mass.  A small of fluid is seen immediately adjacent to the left ovary.  Appendix appears normal.  There is no bowel obstruction.  No free air or portal venous air is identified.  There is no adenopathy or abscess in the abdomen or pelvis.  Aorta is nonaneurysmal.  IMPRESSION: Evidence of recent ovarian cyst leakage on the left.  There is still a small left ovarian cyst with enhancement of the rim of the cyst consistent with recent cyst leakage/rupture.  Appendix appears normal.  No  bowel obstruction.  No abscess.  Liver is enlarged.  No focal liver lesions are identified.  There is either an 8 mm polyp or gallstone within the gallbladder.   Original Report Authenticated By: Bretta Bang, M.D.   US Abdomen Limited 05/21/2013   CLINICAL DATA:  Right upper quadrant pain  EXAM: US ABDOMEN LIMITED - RIGHT UPPER QUADRANT  COMPARISON:  None  FINDINGS: Gallbladder: 8 mm mildly shadowing but non mobile echogenic focus within gallbladder lumen question adherent stone. No gallbladder wall thickening, pericholecystic fluid or sonographic Murphy sign.  Common bile duct: Normal caliber 5 mm diameter  Liver: Minimally increased echogenicity question fatty infiltration of this can be seen with cirrhosis uncertain infiltrative disorders. No focal hepatic mass or nodularity seen however.  No right upper quadrant ascites.  IMPRESSION: Question 8 mm nonmobile gallstone within gallbladder.  Probable fatty infiltration liver as above.   Electronically Signed   By: Ulyses Southward M.D.   On: 05/21/2013 17:04   US Abdomen Limited Ruq 05/15/2013   *RADIOLOGY REPORT*  Ultrasound right upper quadrant  History:  Upper abdominal pain with nausea and vomiting  Comparison:  None  Findings:  Gallbladder is visualized in multiple projections. Within the gallbladder, there is a 7 mm echogenic focus which moves and shadows consistent with cholelithiasis.  Gallbladder wall is thickened to just under 4 mm.  There is no pericholecystic fluid. By report, the patient is not tender over the gallbladder.  There is no intrahepatic or extrahepatic biliary duct dilatation.  Liver is enlarged, measuring 16.8 cm in length.  No focal liver lesions are identified on this study.  Flow in the portal vein is in the anatomic direction.  Conclusion: Cholelithiasis with thickening of the gallbladder wall. Question early cholecystitis.  Note that there is no pericholecystic fluid, however.  No biliary duct dilatation.  Liver enlarged but  otherwise appears within normal limits.   Original Report Authenticated By: Bretta Bang, M.D.   Results for REDITH, DRACH (MRN [id number]) as of 05/21/2013 18:54  Ref. Range 04/15/2008 15:00 05/15/2013 10:52 05/21/2013 17:01  AST Latest Range: 0-37 U/L 66 (H) 90 (H) 30  ALT Latest Range: 0-35 U/L 55 (H) 39 (H) 45 (H)    1845:  Pt has tol PO well while in the ED without N/V.  No stooling while in the ED.  Abd remains benign, VSS, afebrile. With exception of known gallstone, no acute findings on [place] abd or CT A/P. WBC count normal, LFT's improved from her usual baseline elevation. Wants to go home now. Dx and testing d/w pt and family.  Questions answered.  Verb understanding, agreeable to d/c home. Strongly encouraged to f/u with PMD.         Laray Anger, DO 05/24/13 1441

## 2013-05-21 NOTE — ED Notes (Signed)
Patient given discharge instruction, verbalized understand. IV removed, band aid applied. Patient ambulatory out of the department with family 

## 2013-05-21 NOTE — ED Notes (Signed)
Pt reports being sick for 1 week w/ ab pain, right flank and back. Was seen in ed last week and was to be admitted but she left d/t family issues. +nausea, no vomiting, no diarrhea or fever. Unable to get appointment w/ surgeon, "was told to come back to the er"

## 2014-02-26 ENCOUNTER — Emergency Department (HOSPITAL_COMMUNITY)
Admission: EM | Admit: 2014-02-26 | Discharge: 2014-02-26 | Disposition: A | Discharge status: Home / Self Care | Payer: BC Managed Care – PPO | Attending: Emergency Medicine | Admitting: Emergency Medicine

## 2014-02-26 ENCOUNTER — Encounter (HOSPITAL_COMMUNITY): Payer: Self-pay | Admitting: Emergency Medicine

## 2014-02-26 DIAGNOSIS — F3289 Other specified depressive episodes: Secondary | ICD-10-CM | POA: Insufficient documentation

## 2014-02-26 DIAGNOSIS — Z79899 Other long term (current) drug therapy: Secondary | ICD-10-CM | POA: Insufficient documentation

## 2014-02-26 DIAGNOSIS — S025XXA Fracture of tooth (traumatic), initial encounter for closed fracture: Secondary | ICD-10-CM

## 2014-02-26 DIAGNOSIS — F329 Major depressive disorder, single episode, unspecified: Secondary | ICD-10-CM | POA: Insufficient documentation

## 2014-02-26 DIAGNOSIS — G8929 Other chronic pain: Secondary | ICD-10-CM | POA: Insufficient documentation

## 2014-02-26 DIAGNOSIS — F411 Generalized anxiety disorder: Secondary | ICD-10-CM | POA: Insufficient documentation

## 2014-02-26 DIAGNOSIS — K0381 Cracked tooth: Secondary | ICD-10-CM | POA: Insufficient documentation

## 2014-02-26 DIAGNOSIS — Z8679 Personal history of other diseases of the circulatory system: Secondary | ICD-10-CM | POA: Insufficient documentation

## 2014-02-26 DIAGNOSIS — Z87828 Personal history of other (healed) physical injury and trauma: Secondary | ICD-10-CM | POA: Insufficient documentation

## 2014-02-26 DIAGNOSIS — K029 Dental caries, unspecified: Secondary | ICD-10-CM | POA: Insufficient documentation

## 2014-02-26 MED ORDER — AMOXICILLIN 500 MG PO CAPS
500.0000 mg | ORAL_CAPSULE | Freq: Three times a day (TID) | ORAL | Status: DC
Start: 2014-02-26 — End: 2014-04-28

## 2014-02-26 MED ORDER — HYDROCODONE-ACETAMINOPHEN 5-325 MG PO TABS: 1.0000 | ORAL_TABLET | ORAL | Status: DC | PRN

## 2014-02-26 NOTE — ED Provider Notes (Signed)
Medical screening examination/treatment/procedure(s) were performed by non-physician practitioner and as supervising physician I was immediately available for consultation/collaboration.   EKG Interpretation None      Iva Knapp, MD, FACEP   Iva L Knapp, MD 02/26/14 1933 

## 2014-02-26 NOTE — ED Notes (Signed)
Patient c/o right upper dental pain. Per patient upper tooth broke on Sunday while eating a taco. Patient reports some swelling. Unable to see dentist at this time. Patient reports taking extra strength tylenol with no relief.

## 2014-02-26 NOTE — ED Provider Notes (Signed)
CSN: [id number]     Arrival date & time 02/26/14  1541 History   None    Chief Complaint  Patient presents with  . Dental Pain     (Consider location/radiation/quality/duration/timing/severity/associated sxs/prior Treatment) Patient is a 43 y.o. female presenting with tooth pain. The history is provided by the patient.  Dental Pain Location:  Upper Upper teeth location:  9/LU central incisor Quality:  Throbbing and constant Onset quality:  Gradual Duration:  4 days Timing:  Constant Progression:  Worsening Context: dental caries, dental fracture and poor dentition   Relieved by:  Nothing Worsened by:  Cold food/drink and pressure Ineffective treatments:  Acetaminophen Associated symptoms: gum swelling    Pamela May is a 43 y.o. female who presents to the ED with dental pain. She was eating a Taco 4 days ago and her tooth broke. She has been taking extra strength tylenol without relief. She has not been able to get in to see a dentist. She has had swelling of her face in addition to the pain. She states she has chronic dental problems that she has been trying to get fixed but does not have insurance and has not been able to afford to go anywhere. When this happened she could not get an appointment with anyone.   Past Medical History  Diagnosis Date  . Anxiety   . Depression   . Back injury   . Chronic back pain   . Migraine headache   . Chronic dental pain    Past Surgical History  Procedure Laterality Date  . Tubal ligation    . Eye surgery     Family History  Problem Relation Age of Onset  . Diabetes Other   . Heart failure Other    History  Substance Use Topics  . Smoking status: Never Smoker   . Smokeless tobacco: Never Used  . Alcohol Use: No   OB History   Grav Para Term Preterm Abortions TAB SAB Ect Mult Living   4 4 4       4      Review of Systems Negative except as stated in HPI   Allergies  Cortisone base; Ketorolac tromethamine; Sulfa  antibiotics; Ultram; and Ibuprofen  Home Medications   Prior to Admission medications   Medication Sig Start Date End Date Taking? Authorizing Provider  diazepam (VALIUM) 10 MG tablet Take 10 mg by mouth 3 (three) times daily.    Historical Provider, MD  HYDROcodone-acetaminophen (NORCO/VICODIN) 5-325 MG per tablet 1 or 2 tabs PO q6 hours prn pain 05/21/13   Laray AngerKathleen M McManus, DO   BP 122/64  Pulse 72  Temp(Src) 98.2 F (36.8 C) (Oral)  Resp 16  Ht 5\' 7"  (1.702 m)  Wt 230 lb (104.327 kg)  BMI 36.01 kg/m2  SpO2 98%  LMP 02/12/2014 Physical Exam  Nursing note and vitals reviewed. Constitutional: She is oriented to person, place, and time. She appears well-developed and well-nourished. No distress.  HENT:  Head: Normocephalic.  Right Ear: Tympanic membrane normal.  Left Ear: Tympanic membrane normal.  Mouth/Throat: Uvula is midline, oropharynx is clear and moist and mucous membranes are normal.    Multiple dental caries and broken tooth. There is swelling of the gum surrounding the tooth and is tender on exam.   Eyes: EOM are normal.  Neck: Neck supple.  Cardiovascular: Normal rate.   Pulmonary/Chest: Effort normal.  Abdominal: Soft. There is no tenderness.  Musculoskeletal: Normal range of motion.  Neurological: She is alert  and oriented to person, place, and time. No cranial nerve deficit.  Skin: Skin is warm and dry.  Psychiatric: She has a normal mood and affect. Her behavior is normal.    ED Course  Procedures   MDM  43 y.o. female with multiple dental caries and broken front tooth. Will treat for infection/possible abscess. Information given to patient on dental clinics and need for follow up. Stable for discharge without fever. She will return here as needed.    [facility] Orlene OchM Neese, [place]NP 02/26/14 1606

## 2014-04-28 ENCOUNTER — Encounter (HOSPITAL_COMMUNITY): Payer: Self-pay | Admitting: Emergency Medicine

## 2014-04-28 ENCOUNTER — Emergency Department (HOSPITAL_COMMUNITY)
Admission: EM | Admit: 2014-04-28 | Discharge: 2014-04-28 | Disposition: A | Discharge status: Home / Self Care | Payer: BC Managed Care – PPO | Attending: Emergency Medicine | Admitting: Emergency Medicine

## 2014-04-28 DIAGNOSIS — K029 Dental caries, unspecified: Secondary | ICD-10-CM | POA: Diagnosis not present

## 2014-04-28 DIAGNOSIS — F3289 Other specified depressive episodes: Secondary | ICD-10-CM | POA: Insufficient documentation

## 2014-04-28 DIAGNOSIS — F411 Generalized anxiety disorder: Secondary | ICD-10-CM | POA: Diagnosis not present

## 2014-04-28 DIAGNOSIS — K089 Disorder of teeth and supporting structures, unspecified: Secondary | ICD-10-CM | POA: Insufficient documentation

## 2014-04-28 DIAGNOSIS — G43909 Migraine, unspecified, not intractable, without status migrainosus: Secondary | ICD-10-CM | POA: Insufficient documentation

## 2014-04-28 DIAGNOSIS — F329 Major depressive disorder, single episode, unspecified: Secondary | ICD-10-CM | POA: Diagnosis not present

## 2014-04-28 DIAGNOSIS — G8929 Other chronic pain: Secondary | ICD-10-CM | POA: Insufficient documentation

## 2014-04-28 DIAGNOSIS — Z79899 Other long term (current) drug therapy: Secondary | ICD-10-CM | POA: Diagnosis not present

## 2014-04-28 MED ORDER — AMOXICILLIN 500 MG PO CAPS: 500.0000 mg | ORAL_CAPSULE | Freq: Three times a day (TID) | ORAL | Status: DC

## 2014-04-28 MED ORDER — HYDROCODONE-ACETAMINOPHEN 5-325 MG PO TABS: 2.0000 | ORAL_TABLET | ORAL | Status: DC | PRN

## 2014-04-28 MED ORDER — HYDROCODONE-ACETAMINOPHEN 5-325 MG PO TABS
1.0000 | ORAL_TABLET | Freq: Once | ORAL | Status: AC
  Administered 2014-04-28: 1 via ORAL
  Filled 2014-04-28: qty 1

## 2014-04-28 NOTE — Discharge Instructions (Signed)
Dental Pain A tooth ache may be caused by cavities (tooth decay). Cavities expose the nerve of the tooth to air and hot or cold temperatures. It may come from an infection or abscess (also called a boil or furuncle) around your tooth. It is also often caused by dental caries (tooth decay). This causes the pain you are having. DIAGNOSIS  Your caregiver can diagnose this problem by exam. TREATMENT   If caused by an infection, it may be treated with medications which kill germs (antibiotics) and pain medications as prescribed by your caregiver. Take medications as directed.  Only take over-the-counter or prescription medicines for pain, discomfort, or fever as directed by your caregiver.  Whether the tooth ache today is caused by infection or dental disease, you should see your dentist as soon as possible for further care. SEEK MEDICAL CARE IF: The exam and treatment you received today has been provided on an emergency basis only. This is not a substitute for complete medical or dental care. If your problem worsens or new problems (symptoms) appear, and you are unable to meet with your dentist, call or return to this location. SEEK IMMEDIATE MEDICAL CARE IF:   You have a fever.  You develop redness and swelling of your face, jaw, or neck.  You are unable to open your mouth.  You have severe pain uncontrolled by pain medicine. MAKE SURE YOU:   Understand these instructions.  Will watch your condition.  Will get help right away if you are not doing well or get worse. Document Released: 08/28/2005 Document Revised: 11/20/2011 Document Reviewed: 04/15/2008 [facility] Patient Information 2015 [place], [place]. This information is not intended to replace advice given to you by your health care provider. Make sure you discuss any questions you have with your health care provider.  Use the resource information provided to help locate a dental provider.

## 2014-04-28 NOTE — ED Notes (Signed)
Whole mouth pain x 1 week pain made her vomit yesterday knows it is her teeth

## 2014-04-28 NOTE — ED Provider Notes (Signed)
CSN: [id number]     Arrival date & time 04/28/14  1351 History  This chart was scribed for non-physician practitioner, Felicie Morn, NP working with Richardean Canal, MD by Greggory Stallion, ED scribe. This patient was seen in room TR06C/TR06C and the patient's care was started at 4:21 PM.   Chief Complaint  Patient presents with  . Dental Pain   The history is provided by the patient. No language interpreter was used.   HPI Comments: Pamela May is a 43 y.o. female who presents to the Emergency Department complaining of gradual onset diffuse dental pain that started about one week ago. Pt states two of her teeth broken while eating tacos. States she is having associated headaches. Reports one episode of emesis yesterday due to pain. She has taken aleve with no relief. Denies sore throat.   Past Medical History  Diagnosis Date  . Anxiety   . Depression   . Back injury   . Chronic back pain   . Migraine headache   . Chronic dental pain    Past Surgical History  Procedure Laterality Date  . Tubal ligation    . Eye surgery     Family History  Problem Relation Age of Onset  . Diabetes Other   . Heart failure Other    History  Substance Use Topics  . Smoking status: Never Smoker   . Smokeless tobacco: Never Used  . Alcohol Use: No   OB History   Grav Para Term Preterm Abortions TAB SAB Ect Mult Living   4 4 4       4      Review of Systems  HENT: Positive for dental problem. Negative for sore throat.   Neurological: Positive for headaches.  All other systems reviewed and are negative.  Allergies  Cortisone base; Ketorolac tromethamine; Sulfa antibiotics; Ultram; and Ibuprofen  Home Medications   Prior to Admission medications   Medication Sig Start Date End Date Taking? Authorizing Provider  diazepam (VALIUM) 10 MG tablet Take 10 mg by mouth 4 (four) times daily.   Yes Historical Provider, MD  FLUoxetine (PROZAC) 20 MG capsule Take 20 mg by mouth daily.   Yes Historical  Provider, MD  traZODone (DESYREL) 100 MG tablet Take 100 mg by mouth at bedtime.   Yes Historical Provider, MD   BP 116/77  Pulse 79  Temp(Src) 98 F (36.7 C) (Oral)  Resp 16  SpO2 97%  Physical Exam  Nursing note and vitals reviewed. Constitutional: She is oriented to person, place, and time. She appears well-developed and well-nourished. No distress.  HENT:  Head: Normocephalic and atraumatic.  Widespread dental decay.  Eyes: Conjunctivae and EOM are normal.  Neck: Neck supple. No tracheal deviation present.  Cardiovascular: Normal rate, regular rhythm and normal heart sounds.   Pulmonary/Chest: Effort normal and breath sounds normal. No respiratory distress.  Musculoskeletal: Normal range of motion.  Lymphadenopathy:    She has cervical adenopathy.  Right cervical lymphadenopathy.  Neurological: She is alert and oriented to person, place, and time.  Skin: Skin is warm and dry.  Psychiatric: She has a normal mood and affect. Her behavior is normal.    ED Course  Procedures (including critical care time)  DIAGNOSTIC STUDIES: Oxygen Saturation is 97% on RA, normal by my interpretation.    COORDINATION OF CARE: 4:22 PM-Discussed treatment plan which includes an antibiotic and pain medication with pt at bedside and pt agreed to plan. Will give pt dental referrals and advised  her to follow up.   Labs Review Labs Reviewed - No data to display  Imaging Review No results found.   EKG Interpretation None      MDM   Final diagnoses:  None    Dental pain.  Antibiotic, analgesia, dental resource information provided.  I personally performed the services described in this documentation, which was scribed in my presence. The recorded information has been reviewed and is accurate.  Jimmye Normanavid John Marijah Larranaga, NP 04/29/14 [phone]

## 2014-04-29 NOTE — ED Provider Notes (Signed)
Medical screening examination/treatment/procedure(s) were performed by non-physician practitioner and as supervising physician I was immediately available for consultation/collaboration.   EKG Interpretation None        David H Yao, MD 04/29/14 1053 

## 2014-06-03 ENCOUNTER — Encounter (HOSPITAL_COMMUNITY): Payer: Self-pay | Admitting: Emergency Medicine

## 2014-06-03 ENCOUNTER — Emergency Department (HOSPITAL_COMMUNITY)
Admission: EM | Admit: 2014-06-03 | Discharge: 2014-06-03 | Disposition: A | Discharge status: Home / Self Care | Payer: BC Managed Care – PPO | Attending: Emergency Medicine | Admitting: Emergency Medicine

## 2014-06-03 DIAGNOSIS — F3289 Other specified depressive episodes: Secondary | ICD-10-CM | POA: Diagnosis not present

## 2014-06-03 DIAGNOSIS — R51 Headache: Secondary | ICD-10-CM | POA: Diagnosis not present

## 2014-06-03 DIAGNOSIS — Z792 Long term (current) use of antibiotics: Secondary | ICD-10-CM | POA: Insufficient documentation

## 2014-06-03 DIAGNOSIS — Z79899 Other long term (current) drug therapy: Secondary | ICD-10-CM | POA: Diagnosis not present

## 2014-06-03 DIAGNOSIS — Z8679 Personal history of other diseases of the circulatory system: Secondary | ICD-10-CM | POA: Insufficient documentation

## 2014-06-03 DIAGNOSIS — Z87828 Personal history of other (healed) physical injury and trauma: Secondary | ICD-10-CM | POA: Diagnosis not present

## 2014-06-03 DIAGNOSIS — K029 Dental caries, unspecified: Secondary | ICD-10-CM | POA: Insufficient documentation

## 2014-06-03 DIAGNOSIS — G8929 Other chronic pain: Secondary | ICD-10-CM | POA: Insufficient documentation

## 2014-06-03 DIAGNOSIS — K089 Disorder of teeth and supporting structures, unspecified: Secondary | ICD-10-CM | POA: Diagnosis not present

## 2014-06-03 DIAGNOSIS — F329 Major depressive disorder, single episode, unspecified: Secondary | ICD-10-CM | POA: Insufficient documentation

## 2014-06-03 DIAGNOSIS — F411 Generalized anxiety disorder: Secondary | ICD-10-CM | POA: Insufficient documentation

## 2014-06-03 DIAGNOSIS — K0889 Other specified disorders of teeth and supporting structures: Secondary | ICD-10-CM

## 2014-06-03 MED ORDER — ONDANSETRON HCL 4 MG PO TABS
4.0000 mg | ORAL_TABLET | Freq: Once | ORAL | Status: AC
  Administered 2014-06-03: 4 mg via ORAL
  Filled 2014-06-03: qty 1

## 2014-06-03 MED ORDER — AMOXICILLIN 250 MG PO CAPS
500.0000 mg | ORAL_CAPSULE | Freq: Once | ORAL | Status: AC
  Administered 2014-06-03: 500 mg via ORAL
  Filled 2014-06-03: qty 2

## 2014-06-03 MED ORDER — ACETAMINOPHEN-CODEINE #3 300-30 MG PO TABS
1.0000 | ORAL_TABLET | Freq: Once | ORAL | Status: AC
  Administered 2014-06-03: 1 via ORAL
  Filled 2014-06-03: qty 1

## 2014-06-03 MED ORDER — AMOXICILLIN 500 MG PO CAPS: 500.0000 mg | ORAL_CAPSULE | Freq: Three times a day (TID) | ORAL | Status: DC

## 2014-06-03 MED ORDER — ACETAMINOPHEN-CODEINE #3 300-30 MG PO TABS: 1.0000 | ORAL_TABLET | Freq: Four times a day (QID) | ORAL | Status: DC | PRN

## 2014-06-03 NOTE — ED Notes (Signed)
Pt c/o dental pain from broken tooth.

## 2014-06-03 NOTE — Discharge Instructions (Signed)
Please use warm saltwater swishes 2 times daily. Use Amoxil 3 times daily. Use Tylenol codeine for pain if needed. This medication may cause drowsiness, please use with caution. It is extremely important that you see a dentist concerning today's dental problem. Dental Pain Toothache is pain in or around a tooth. It may get worse with chewing or with cold or heat.  HOME CARE  Your dentist may use a numbing medicine during treatment. If so, you may need to avoid eating until the medicine wears off. Ask your dentist about this.  Only take medicine as told by your dentist or doctor.  Avoid chewing food near the painful tooth until after all treatment is done. Ask your dentist about this. GET HELP RIGHT AWAY IF:   The problem gets worse or new problems appear.  You have a fever.  There is redness and puffiness (swelling) of the face, jaw, or neck.  You cannot open your mouth.  There is pain in the jaw.  There is very bad pain that is not helped by medicine. MAKE SURE YOU:   Understand these instructions.  Will watch your condition.  Will get help right away if you are not doing well or get worse. Document Released: 02/14/2008 Document Revised: 11/20/2011 Document Reviewed: 02/14/2008 [facility] Patient Information 2015 [place], [place]. This information is not intended to replace advice given to you by your health care provider. Make sure you discuss any questions you have with your health care provider.

## 2014-06-03 NOTE — ED Provider Notes (Signed)
CSN: [id number]     Arrival date & time 06/03/14  1948 History   None    Chief Complaint  Patient presents with  . Dental Pain     (Consider location/radiation/quality/duration/timing/severity/associated sxs/prior Treatment) Patient is a 43 y.o. female presenting with tooth pain. The history is provided by the patient.  Dental Pain Location:  Upper Upper teeth location:  7/RU lateral incisor Quality:  Shooting and aching Severity:  Severe Onset quality:  Gradual Duration:  1 day Timing:  Intermittent Progression:  Worsening Chronicity:  Chronic Context: dental caries and poor dentition   Relieved by:  Nothing Ineffective treatments:  None tried Associated symptoms: gum swelling and headaches   Associated symptoms: no neck pain   Risk factors: periodontal disease     Past Medical History  Diagnosis Date  . Anxiety   . Depression   . Back injury   . Chronic back pain   . Migraine headache   . Chronic dental pain    Past Surgical History  Procedure Laterality Date  . Tubal ligation    . Eye surgery    . Cholecystectomy     Family History  Problem Relation Age of Onset  . Diabetes Other   . Heart failure Other    History  Substance Use Topics  . Smoking status: Never Smoker   . Smokeless tobacco: Never Used  . Alcohol Use: No   OB History   Grav Para Term Preterm Abortions TAB SAB Ect Mult Living   Review of Systems  Constitutional: Negative for activity change.       All ROS Neg except as noted in HPI  HENT: Positive for dental problem. Negative for nosebleeds.   Eyes: Negative for photophobia and discharge.  Respiratory: Negative for cough, shortness of breath and wheezing.   Cardiovascular: Negative for chest pain and palpitations.  Gastrointestinal: Negative for abdominal pain and blood in stool.  Genitourinary: Negative for dysuria, frequency and hematuria.  Musculoskeletal: Positive for back pain. Negative for arthralgias and  neck pain.  Skin: Negative.   Neurological: Positive for headaches. Negative for dizziness, seizures and speech difficulty.  Psychiatric/Behavioral: Negative for hallucinations and confusion. The patient is nervous/anxious.       Allergies  Cortisone base; Ketorolac tromethamine; Sulfa antibiotics; Ultram; and Ibuprofen  Home Medications   Prior to Admission medications   Medication Sig Start Date End Date Taking? Authorizing Provider  amoxicillin (AMOXIL) 500 MG capsule Take 1 capsule (500 mg total) by mouth 3 (three) times daily. 04/28/14   Jimmye Norman, NP  diazepam (VALIUM) 10 MG tablet Take 10 mg by mouth 4 (four) times daily.    Historical Provider, MD  FLUoxetine (PROZAC) 20 MG capsule Take 20 mg by mouth daily.    Historical Provider, MD  HYDROcodone-acetaminophen (NORCO/VICODIN) 5-325 MG per tablet Take 2 tablets by mouth every 4 (four) hours as needed. 04/28/14   Jimmye Norman, NP  traZODone (DESYREL) 100 MG tablet Take 100 mg by mouth at bedtime.    Historical Provider, MD   BP 120/76  Pulse 85  Temp(Src) 98 F (36.7 C) (Oral)  Resp 18  Ht  (1.702 m)  Wt 230 lb (104.327 kg)  BMI 36.01 kg/m2  SpO2 99%  LMP 05/12/2014 Physical Exam  Nursing note and vitals reviewed. Constitutional: She is oriented to person, place, and time. She appears well-developed and well-nourished.  Non-toxic appearance.  HENT:  Head: Normocephalic.  Right Ear: Tympanic membrane and external ear normal.  Left Ear: Tympanic membrane and external ear normal.  Mouth/Throat: Abnormal dentition. Dental caries present. No dental abscesses or uvula swelling.    Multiple dental caries. Many decayed to the gum line., Swollen on the upper and lower jaw area. No abscess appreciated. Airway is patent. No swelling under the tongue.  Eyes: EOM and lids are normal. Pupils are equal, round, and reactive to light.  Neck: Normal range of motion. Neck supple. Carotid bruit is not present.    Cardiovascular: Normal rate, regular rhythm, normal heart sounds, intact distal pulses and normal pulses.   Pulmonary/Chest: Breath sounds normal. No respiratory distress.  Abdominal: Soft. Bowel sounds are normal. There is no tenderness. There is no guarding.  Musculoskeletal: Normal range of motion.  Lymphadenopathy:       Head (right side): No submandibular adenopathy present.       Head (left side): No submandibular adenopathy present.    She has no cervical adenopathy.  Neurological: She is alert and oriented to person, place, and time. She has normal strength. No cranial nerve deficit or sensory deficit.  Skin: Skin is warm and dry.  Psychiatric: She has a normal mood and affect. Her speech is normal.    ED Course  Procedures (including critical care time) Labs Review Labs Reviewed - No data to display  Imaging Review No results found.   EKG Interpretation None      MDM  Patient has multiple dental caries present. Tooth #7 appears to have had a cavity and broke. The patient is a small female in the residual of the tooth that was left. There is no visible abscess noted. There is swelling around the gum of the upper and lower jaw. There is no evidence for a Ludwig's angina.   Final diagnoses:  None    *I have reviewed nursing notes, vital signs, and all appropriate lab and imaging results for this patient.**    Kathie Dike, PA-C 06/03/14 2102  Kathie Dike, PA-C 06/03/14 2103

## 2014-06-04 NOTE — ED Provider Notes (Signed)
Medical screening examination/treatment/procedure(s) were performed by non-physician practitioner and as supervising physician I was immediately available for consultation/collaboration.     Zareena Willis, MD 06/04/14 0053 

## 2014-06-22 ENCOUNTER — Encounter (INDEPENDENT_AMBULATORY_CARE_PROVIDER_SITE_OTHER): Payer: Self-pay

## 2014-06-22 ENCOUNTER — Encounter: Payer: Self-pay | Admitting: Family Medicine

## 2014-06-22 ENCOUNTER — Ambulatory Visit (INDEPENDENT_AMBULATORY_CARE_PROVIDER_SITE_OTHER): Payer: BC Managed Care – PPO | Admitting: Family Medicine

## 2014-06-22 VITALS — BP 126/69 | HR 93 | Temp 98.1°F | Ht 67.0 in | Wt 231.0 lb

## 2014-06-22 DIAGNOSIS — M5441 Lumbago with sciatica, right side: Secondary | ICD-10-CM

## 2014-06-22 DIAGNOSIS — R200 Anesthesia of skin: Secondary | ICD-10-CM

## 2014-06-22 MED ORDER — CYCLOBENZAPRINE HCL 10 MG PO TABS: 10.0000 mg | ORAL_TABLET | Freq: Three times a day (TID) | ORAL | Status: DC | PRN
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
## 2014-06-25 ENCOUNTER — Telehealth: Payer: Self-pay | Admitting: *Deleted

## 2014-06-25 ENCOUNTER — Other Ambulatory Visit: Payer: Self-pay | Admitting: Family Medicine

## 2014-06-25 NOTE — Telephone Encounter (Signed)
Patient states she is in severe pain and knows that we are working on her referral but wants to know is there anything you can give her to help

## 2014-06-25 NOTE — Telephone Encounter (Signed)
I am sorry but I do not rx anythig stronger than flexeril for pain.  She can follow up with me if she wants but I probably will not rx anything else.  She can come in and get another toradol shot for pain but will have to be seen.

## 2014-06-26 NOTE — Telephone Encounter (Signed)
Patient aware,

## 2014-07-01 ENCOUNTER — Telehealth: Payer: Self-pay | Admitting: Family Medicine

## 2014-07-02 ENCOUNTER — Telehealth: Payer: Self-pay

## 2014-07-02 NOTE — Telephone Encounter (Signed)
Calling about referral for pain management and Neurology   (There is no referral for either of these)

## 2014-07-03 ENCOUNTER — Other Ambulatory Visit: Payer: Self-pay | Admitting: Family Medicine

## 2014-07-03 DIAGNOSIS — M5440 Lumbago with sciatica, unspecified side: Secondary | ICD-10-CM

## 2014-07-03 NOTE — Telephone Encounter (Signed)
Pt aware ref placed

## 2014-07-03 NOTE — Telephone Encounter (Signed)
Referral to pain clinic made and there is nothing I can help her with

## 2014-07-13 ENCOUNTER — Encounter: Payer: Self-pay | Admitting: Family Medicine

## 2014-07-15 ENCOUNTER — Telehealth: Payer: Self-pay

## 2014-07-15 ENCOUNTER — Other Ambulatory Visit: Payer: Self-pay | Admitting: Family Medicine

## 2014-07-15 DIAGNOSIS — M545 Low back pain, unspecified: Secondary | ICD-10-CM

## 2014-07-15 NOTE — Telephone Encounter (Signed)
Mri ordered 

## 2014-07-20 ENCOUNTER — Telehealth: Payer: Self-pay | Admitting: Family Medicine

## 2014-07-20 NOTE — Telephone Encounter (Signed)
Pt stated she needs something for pain to lay flat while having MRI tomorrow Please advise

## 2014-07-21 ENCOUNTER — Other Ambulatory Visit: Payer: Self-pay | Admitting: Family Medicine

## 2014-07-21 ENCOUNTER — Ambulatory Visit (HOSPITAL_COMMUNITY)
Admission: RE | Admit: 2014-07-21 | Discharge: 2014-07-21 | Disposition: A | Discharge status: Home / Self Care | Payer: BC Managed Care – PPO | Source: Ambulatory Visit | Attending: Family Medicine | Admitting: Family Medicine

## 2014-07-21 DIAGNOSIS — M545 Low back pain, unspecified: Secondary | ICD-10-CM

## 2014-07-21 DIAGNOSIS — M5136 Other intervertebral disc degeneration, lumbar region: Secondary | ICD-10-CM | POA: Insufficient documentation

## 2014-07-21 NOTE — Telephone Encounter (Signed)
Patient aware.

## 2014-07-21 NOTE — Telephone Encounter (Signed)
Please take motrin and tylenol otc for pain during MRI.

## 2014-07-22 ENCOUNTER — Telehealth: Payer: Self-pay

## 2014-07-22 NOTE — Telephone Encounter (Signed)
Take tylenol and motrin otc and I agree PT is a good option. I have nothing to offer her for her pain other than a pain referral and if she will not agree to PT then there is probably nothing we can offer and she may need to seek care else where if she feels she needs pain medicine.

## 2014-07-22 NOTE — Telephone Encounter (Signed)
Aware of MRI results. 

## 2014-07-22 NOTE — Telephone Encounter (Signed)
Stp, and advised she can take otc tylenol/motrin or do PT, pt states she can't do PT, pt states she is waiting for Pain management referral from [facility], pt voiced understanding that we couldn't give her anything for pain and will wait to here back from [facility] about pain management.

## 2014-08-06 ENCOUNTER — Emergency Department (HOSPITAL_COMMUNITY)
Admission: EM | Admit: 2014-08-06 | Discharge: 2014-08-06 | Disposition: A | Discharge status: Home / Self Care | Payer: BC Managed Care – PPO | Attending: Emergency Medicine | Admitting: Emergency Medicine

## 2014-08-06 ENCOUNTER — Encounter (HOSPITAL_COMMUNITY): Payer: Self-pay | Admitting: *Deleted

## 2014-08-06 DIAGNOSIS — Z79891 Long term (current) use of opiate analgesic: Secondary | ICD-10-CM | POA: Insufficient documentation

## 2014-08-06 DIAGNOSIS — K029 Dental caries, unspecified: Secondary | ICD-10-CM | POA: Insufficient documentation

## 2014-08-06 DIAGNOSIS — G43909 Migraine, unspecified, not intractable, without status migrainosus: Secondary | ICD-10-CM | POA: Insufficient documentation

## 2014-08-06 DIAGNOSIS — G8929 Other chronic pain: Secondary | ICD-10-CM | POA: Insufficient documentation

## 2014-08-06 DIAGNOSIS — K088 Other specified disorders of teeth and supporting structures: Secondary | ICD-10-CM | POA: Diagnosis present

## 2014-08-06 DIAGNOSIS — Z79899 Other long term (current) drug therapy: Secondary | ICD-10-CM | POA: Insufficient documentation

## 2014-08-06 DIAGNOSIS — K047 Periapical abscess without sinus: Secondary | ICD-10-CM | POA: Diagnosis not present

## 2014-08-06 DIAGNOSIS — F419 Anxiety disorder, unspecified: Secondary | ICD-10-CM | POA: Diagnosis not present

## 2014-08-06 DIAGNOSIS — R112 Nausea with vomiting, unspecified: Secondary | ICD-10-CM | POA: Insufficient documentation

## 2014-08-06 DIAGNOSIS — Z87828 Personal history of other (healed) physical injury and trauma: Secondary | ICD-10-CM | POA: Diagnosis not present

## 2014-08-06 DIAGNOSIS — F329 Major depressive disorder, single episode, unspecified: Secondary | ICD-10-CM | POA: Insufficient documentation

## 2014-08-06 MED ORDER — PENICILLIN V POTASSIUM 250 MG PO TABS
500.0000 mg | ORAL_TABLET | Freq: Once | ORAL | Status: AC
  Administered 2014-08-06: 500 mg via ORAL
  Filled 2014-08-06: qty 2

## 2014-08-06 MED ORDER — ONDANSETRON 4 MG PO TBDP
4.0000 mg | ORAL_TABLET | Freq: Once | ORAL | Status: AC
  Administered 2014-08-06: 4 mg via ORAL
  Filled 2014-08-06: qty 1

## 2014-08-06 MED ORDER — OXYCODONE-ACETAMINOPHEN 5-325 MG PO TABS: 1.0000 | ORAL_TABLET | Freq: Four times a day (QID) | ORAL | Status: DC | PRN

## 2014-08-06 MED ORDER — OXYCODONE-ACETAMINOPHEN 5-325 MG PO TABS
2.0000 | ORAL_TABLET | Freq: Once | ORAL | Status: AC
  Administered 2014-08-06: 2 via ORAL
  Filled 2014-08-06: qty 2

## 2014-08-06 MED ORDER — PENICILLIN V POTASSIUM 500 MG PO TABS
500.0000 mg | ORAL_TABLET | Freq: Three times a day (TID) | ORAL | Status: DC
Start: 2014-08-06 — End: 2015-02-18

## 2014-08-06 NOTE — Discharge Instructions (Signed)
Please read and follow all provided instructions.  Your diagnoses today include:  1. Periapical abscess with facial involvement     The exam and treatment you received today has been provided on an emergency basis only. This is not a substitute for complete medical or dental care.  Tests performed today include:  Vital signs. See below for your results today.   Medications prescribed:   Percocet (oxycodone/acetaminophen) - narcotic pain medication  DO NOT drive or perform any activities that require you to be awake and alert because this medicine can make you drowsy. BE VERY CAREFUL not to take multiple medicines containing Tylenol (also called acetaminophen). Doing so can lead to an overdose which can damage your liver and cause liver failure and possibly death.   Penicillin - antibiotic  You have been prescribed an antibiotic medicine: take the entire course of medicine even if you are feeling better. Stopping early can cause the antibiotic not to work.  Take any prescribed medications only as directed.  Home care instructions:  Follow any educational materials contained in this packet.  Follow-up instructions: Please follow-up with your dentist for further evaluation of your symptoms.   Dental Assistance: See below for dental referrals  Return instructions:   Please return to the Emergency Department if you experience worsening symptoms.  Please return if you develop a fever, you develop more swelling in your face or neck, you have trouble breathing or swallowing food.  Return with worsening facial swelling, as we discussed, this area may become bigger with time and need to be drained at a later date.   Please return if you have any other emergent concerns.  Additional Information:  Your vital signs today were: BP 115/56 mmHg   Pulse 75   Temp(Src) 98.2 F (36.8 C) (Oral)   Resp 19   SpO2 99%   LMP 07/13/2014 If your blood pressure (BP) was elevated above 135/85  this visit, please have this repeated by your doctor within one month. -------------- Dental Care: Organization         Address  Phone  Notes  [facility] [address], [place] [phone] Accepts children up to age 44 who are enrolled in [place] or [place] Health Choice; pregnant women with a Medicaid card; and children who have applied for Medicaid or [place] Health Choice, but were declined, whose parents can pay a reduced fee at time of service.  [facility]  [address], [place] [phone] Accepts children up to age 44 who are enrolled in [place] or [place] Health Choice; pregnant women with a Medicaid card; and children who have applied for Medicaid or  Health Choice, but were declined, whose parents can pay a reduced fee at time of service.  [facility]  [address], [place] [phone] Patients are seen by appointment only. Walk-ins are not accepted. [facility] will see patients 44 years of age and older. Monday - Tuesday (8am-5pm) Most Wednesdays (8:30-5pm) $30 per visit, cash only  [facility]  [address], [facility] [place] [phone] Patients are seen by appointment only. Walk-ins are not accepted. [facility] will see patients 44 years of age and older. One Wednesday Evening (Monthly: Volunteer Based).  $30 per visit, cash only  [facility]  [phone] for adults; Children under age 494, call Graduate Pediatric Dentistry  at [phone]. Children aged 574-14, please call [phone] to request a pediatric application.  Dental services are provided in all areas of dental care including fillings, crowns and bridges, complete and partial dentures, implants, gum treatment, root canals, and extractions. Preventive care is also provided. Treatment is provided to both adults and  children. Patients are selected via a lottery and there is often a waiting list.   [facility] [address], [place]  [phone] [email or web address]   [facility] [address], [place], [place] [phone], Ext. [phone] Second and Fourth Thursday of each month, opens at 6:30 AM; Clinic ends at 9 AM.  Patients are seen on a first-come first-served basis, and a limited number are seen during each clinic.   [facility]  [address], [place], [place] [phone]   Eligibility Requirements You must have lived in [place], [place], or [place] counties for at least the last three months.   You cannot be eligible for state or federal sponsored [place]care insurance, including [employer], [place], or [employer]icare.   You generally cannot be eligible for healthcare insurance through your employer.    How to apply: Eligibility screenings are held every Tuesday and Wednesday afternoon from 1:00 pm until 4:00 pm. You do not need an appointment for the interview!  [facility] [address], [place], [place] [phone]   [facility]  [phone]   [facility]  [phone]   [facility]  [phone]

## 2014-08-06 NOTE — ED Notes (Signed)
PT c/o R upper molar pain x 2 days and R facial swelling and nausea since this am.

## 2014-08-06 NOTE — ED Provider Notes (Signed)
CSN: [id number]     Arrival date & time 08/06/14  1504 History   First MD Initiated Contact with Patient 08/06/14 1521     Chief Complaint  Patient presents with  . Facial Swelling    dental pain  . Nausea     (Consider location/radiation/quality/duration/timing/severity/associated sxs/prior Treatment) HPI Comments: Patient presents with c/o R upper facial swelling this morning. She has noted R incisor pain x 2 days. She also has had vomiting this morning x 6. Unable to keep down medications including Tylenol. No neck swelling or trouble swallowing. No abdominal pain, chest pain. The onset of this condition was acute. The course is constant. Aggravating factors: none. Alleviating factors: none.     The history is provided by the patient.    Past Medical History  Diagnosis Date  . Anxiety   . Depression   . Back injury   . Chronic back pain   . Migraine headache   . Chronic dental pain    Past Surgical History  Procedure Laterality Date  . Tubal ligation    . Eye surgery    . Cholecystectomy     Family History  Problem Relation Age of Onset  . Diabetes Other   . Heart failure Other    History  Substance Use Topics  . Smoking status: Never Smoker   . Smokeless tobacco: Never Used  . Alcohol Use: No   OB History    Gravida Para Term Preterm AB TAB SAB Ectopic Multiple Living   4 4 4       4      Review of Systems  Constitutional: Negative for fever.  HENT: Positive for dental problem and facial swelling. Negative for ear pain, sore throat and trouble swallowing.   Respiratory: Negative for shortness of breath and stridor.   Cardiovascular: Negative for chest pain.  Gastrointestinal: Positive for nausea and vomiting. Negative for abdominal pain.  Musculoskeletal: Negative for neck pain.  Skin: Negative for color change.  Neurological: Negative for headaches.    Allergies  Cortisone base; Ketorolac tromethamine; Sulfa antibiotics; Ultram; and Ibuprofen  Home  Medications   Prior to Admission medications   Medication Sig Start Date End Date Taking? Authorizing Provider  acetaminophen-codeine (TYLENOL #3) 300-30 MG per tablet Take 1-2 tablets by mouth every 6 (six) hours as needed for moderate pain. 06/03/14   Kathie DikeHobson M Bryant, PA-C  cyclobenzaprine (FLEXERIL) 10 MG tablet Take 1 tablet (10 mg total) by mouth 3 (three) times daily as needed for muscle spasms. 06/22/14   Deatra CanterWilliam J Oxford, FNP  diazepam (VALIUM) 10 MG tablet Take 10 mg by mouth 4 (four) times daily.    Historical Provider, MD  FLUoxetine (PROZAC) 20 MG capsule Take 20 mg by mouth daily.    Historical Provider, MD  HYDROcodone-acetaminophen (NORCO/VICODIN) 5-325 MG per tablet Take 2 tablets by mouth every 4 (four) hours as needed. 04/28/14   Jimmye Normanavid John Smith, NP  traZODone (DESYREL) 100 MG tablet Take 100 mg by mouth at bedtime.    Historical Provider, MD   BP 123/92 mmHg  Pulse 80  Temp(Src) 98.1 F (36.7 C) (Oral)  Resp 20  SpO2 97%  LMP 07/13/2014   Physical Exam  Constitutional: She appears well-developed and well-nourished.  HENT:  Head: Normocephalic and atraumatic.  Right Ear: Tympanic membrane, external ear and ear canal normal.  Left Ear: Tympanic membrane, external ear and ear canal normal.  Nose: Nose normal.  Mouth/Throat: Uvula is midline, oropharynx is clear and moist  and mucous membranes are normal. No trismus in the jaw. Abnormal dentition. Dental caries present. No dental abscesses or uvula swelling. No tonsillar abscesses.  Severe periodontal disease. Patient with R maxillary tooth pain and tenderness to palpation in area of lateral incisor. There is gross facial swelling with no discrete abscess noted on palpation at this point in time.   Eyes: Conjunctivae are normal.  Neck: Normal range of motion. Neck supple.  No neck swelling or Ludwig's angina  Lymphadenopathy:    She has no cervical adenopathy.  Neurological: She is alert.  Skin: Skin is warm and dry.   Psychiatric: She has a normal mood and affect.  Nursing note and vitals reviewed.   ED Course  Procedures (including critical care time) Labs Review Labs Reviewed - No data to display  Imaging Review No results found.   EKG Interpretation None       3:30 PM Patient seen and examined. Medications ordered.   Vital signs reviewed and are as follows: BP 123/92 mmHg  Pulse 80  Temp(Src) 98.1 F (36.7 C) (Oral)  Resp 20  SpO2 97%  LMP 07/13/2014  Will use US to eval for periapical abscess.    4:18 PM EMERGENCY DEPARTMENT US SOFT TISSUE INTERPRETATION "Study: Limited Ultrasound of the noted body part in comments below"  INDICATIONS: Soft tissue infection Multiple views of the body part are obtained with a multi-frequency linear probe  PERFORMED BY:  Myself  IMAGES ARCHIVED?: Yes  SIDE:Right   BODY PART:Other soft tisse (comment in note)  FINDINGS: No abcess noted and Cellulitis present  LIMITATIONS:  none  INTERPRETATION:  Abcess present and Cellulitis present  COMMENT:  Performed on R face to look for any drainable periapical abscess  Patient has not vomited and wishes to go home.   Patient counseled on use of narcotic pain medications. Counseled not to combine these medications with others containing tylenol. Urged not to drink alcohol, drive, or perform any other activities that requires focus while taking these medications. The patient verbalizes understanding and agrees with the plan.  Patient counseled to take prescribed medications as directed, return with worsening facial or neck swelling, and to follow-up with their dentist as soon as possible.    MDM   Final diagnoses:  Periapical abscess with facial involvement   Patient with toothache, facial swelling, suspect developing periapical abscess although no drainable fluid collection identified at this time. No fever. Exam unconcerning for Ludwig's angina or other deep tissue infection in neck.   As  there is gum swelling, erythema, and facial swelling, will treat with antibiotic and pain medicine. Urged patient to follow-up with dentist.      Renne CriglerJoshua Mykai Wendorf, PA-C 08/06/14 1621  Derwood KaplanAnkit Nanavati, MD 08/08/14 0157

## 2014-08-27 NOTE — Telephone Encounter (Signed)
Close encounter 

## 2014-08-28 ENCOUNTER — Encounter: Payer: Self-pay | Admitting: Physical Medicine & Rehabilitation

## 2014-10-01 ENCOUNTER — Encounter: Payer: Self-pay | Admitting: Physical Medicine & Rehabilitation

## 2014-10-01 ENCOUNTER — Ambulatory Visit (HOSPITAL_BASED_OUTPATIENT_CLINIC_OR_DEPARTMENT_OTHER): Payer: BLUE CROSS/BLUE SHIELD | Admitting: Physical Medicine & Rehabilitation

## 2014-10-01 ENCOUNTER — Encounter: Payer: BLUE CROSS/BLUE SHIELD | Attending: Physical Medicine & Rehabilitation

## 2014-10-01 ENCOUNTER — Other Ambulatory Visit: Payer: Self-pay | Admitting: Physical Medicine & Rehabilitation

## 2014-10-01 VITALS — BP 131/83 | HR 84 | Resp 14

## 2014-10-01 DIAGNOSIS — Z79899 Other long term (current) drug therapy: Secondary | ICD-10-CM

## 2014-10-01 DIAGNOSIS — G8929 Other chronic pain: Secondary | ICD-10-CM | POA: Insufficient documentation

## 2014-10-01 DIAGNOSIS — M47816 Spondylosis without myelopathy or radiculopathy, lumbar region: Secondary | ICD-10-CM | POA: Diagnosis not present

## 2014-10-01 DIAGNOSIS — G894 Chronic pain syndrome: Secondary | ICD-10-CM

## 2014-10-01 DIAGNOSIS — M545 Low back pain, unspecified: Secondary | ICD-10-CM

## 2014-10-01 DIAGNOSIS — M5136 Other intervertebral disc degeneration, lumbar region: Secondary | ICD-10-CM | POA: Insufficient documentation

## 2014-10-01 DIAGNOSIS — Z5181 Encounter for therapeutic drug level monitoring: Secondary | ICD-10-CM

## 2014-10-01 DIAGNOSIS — M51369 Other intervertebral disc degeneration, lumbar region without mention of lumbar back pain or lower extremity pain: Secondary | ICD-10-CM | POA: Insufficient documentation

## 2014-10-01 MED ORDER — CYCLOBENZAPRINE HCL 5 MG PO TABS: 5.0000 mg | ORAL_TABLET | Freq: Three times a day (TID) | ORAL | Status: DC | PRN

## 2014-10-01 NOTE — Patient Instructions (Signed)
Treatment options include nonnarcotic medications such as muscle relaxers  Referral to physical therapy which has been made  We discussed lumbar injections which she do not wish to do at the current time.

## 2014-10-01 NOTE — Progress Notes (Signed)
Subjective:    Patient ID: Pamela BorderLori A Ganesh, female    DOB: January 03, 1971, 44 y.o.   MRN: [id number]  HPI Chief complaint low back pain radiating into both thighs and into both feet.  Onset of pain around 2008. Patient feels like symptoms have been worsening over recent months. Previous treatments include medications include Percocet prescribed by prior neurologist for about 5 years.  Patient also treated for migraines by the same neurologist. Patient states she got some type of scan which included her head but also included other parts of her body.  Reviewed MRI from November 2015. Sagittal views good quality. No evidence of significant disc herniations. Small disc at L5-S1. Axial views some motion degeneration however clearly no evidence of nerve root impingement or central stenosis. There is some evidence of lumbar spondylosis at L5-S1 and L4-5.  Ambulates without assisted device, independent with all self-care and mobility. Pain Inventory Average Pain 9 Pain Right Now 10 My pain is constant, sharp, burning and stabbing  In the last 24 hours, has pain interfered with the following? General activity 9 Relation with others 10 Enjoyment of life 9 What TIME of day is your pain at its worst? morning, evening Sleep (in general) Fair  Pain is worse with: sitting and standing Pain improves with: medication Relief from Meds: 5  Mobility walk without assistance how many minutes can you walk? 5 ability to climb steps?  yes do you drive?  yes Do you have any goals in this area?  no  Function not employed: date last employed . disabled: date disabled . I need assistance with the following:  household duties and shopping Do you have any goals in this area?  no  Neuro/Psych numbness tingling spasms depression anxiety  Prior Studies new visit  Physicians involved in your care new visit   Family History  Problem Relation Age of Onset  . Diabetes Other   . Heart failure  Other    History   Social History  . Marital Status: Single    Spouse Name: N/A    Number of Children: N/A  . Years of Education: N/A   Social History Main Topics  . Smoking status: Never Smoker   . Smokeless tobacco: Never Used  . Alcohol Use: No  . Drug Use: No  . Sexual Activity: Yes    Birth Control/ Protection: Surgical   Other Topics Concern  . None   Social History Narrative   Past Surgical History  Procedure Laterality Date  . Tubal ligation    . Eye surgery    . Cholecystectomy     Past Medical History  Diagnosis Date  . Anxiety   . Depression   . Back injury   . Chronic back pain   . Migraine headache   . Chronic dental pain    BP 131/83 mmHg  Pulse 84  Resp 14  SpO2 97%  Opioid Risk Score: 4 Fall Risk Score:    Review of Systems  Neurological: Positive for numbness.       Tingling  Spasms   Psychiatric/Behavioral: Positive for dysphoric mood. The patient is nervous/anxious.   All other systems reviewed and are negative.      Objective:   Physical Exam  Constitutional: She is oriented to person, place, and time. She appears well-developed and well-nourished.  HENT:  Head: Normocephalic and atraumatic.  Eyes: Conjunctivae and EOM are normal. Pupils are equal, round, and reactive to light.  Neck: Normal range of motion. Neck  supple.  Neurological: She is alert and oriented to person, place, and time. She has normal reflexes.  Reflex Scores:      Tricep reflexes are 2+ on the right side and 2+ on the left side.      Bicep reflexes are 2+ on the right side and 2+ on the left side.      Brachioradialis reflexes are 2+ on the right side and 2+ on the left side.      Patellar reflexes are 2+ on the right side and 2+ on the left side.      Achilles reflexes are 2+ on the right side and 2+ on the left side. Psychiatric: She has a normal mood and affect.  Nursing note and vitals reviewed.  Lumbar range of motion limited with flexion extension  lateral bending and rotation approximately 25% range of motion.  Tenderness with light palpation lower lumbar area No evidence of spine deformity. Negative straight leg raise Motor strength 5/5 bilateral upper extremity some the deltoid biceps and triceps grip 4/5 and hip flexor and extensor ankle dorsiflexor with give way weakness. Sensation intact to pinprick in L4-5 and S1 dermatomal distribution.      Assessment & Plan:  1.  Chronic low back pain with mild degenerative disc and lumbar spondylosis No evidence of radiculopathy, no stenosis on imaging studies We discussed that while her back pain is bothersome and is not serious and will not require a surgical referral at the current time. We discussed treatment options Physical therapy would be the most helpful for her given her limitation with range of motion. She will do this closer to home in [facility] In addition will prescribe cyclobenzaprine 5 mg 3 times a day when necessary for her pain. She has multiple medication allergies and intolerances. We discussed that there is no clear-cut indication for narcotic analgesics in this situation. And that above treatment options are more appropriate We discussed interventional procedures including lumbar medial branch blocks and have given her information regarding this. At this point she does not wish to schedule.  RTC 6 weeks

## 2014-10-02 LAB — PMP ALCOHOL METABOLITE (ETG): Ethyl Glucuronide (EtG): NEGATIVE ng/mL

## 2014-10-08 LAB — OPIATES/OPIOIDS (LC/MS-MS)
CODEINE URINE: NEGATIVE ng/mL (ref <50)
Hydrocodone: 74 ng/mL (ref <50)
Hydromorphone: NEGATIVE ng/mL — AB (ref <50)
MORPHINE: NEGATIVE ng/mL (ref <50)
Norhydrocodone, Ur: 163 ng/mL (ref <50)
Noroxycodone, Ur: NEGATIVE ng/mL (ref <50)
OXYMORPHONE, URINE: 169 ng/mL — AB (ref <50)
Oxycodone, ur: NEGATIVE ng/mL (ref <50)

## 2014-10-08 LAB — BENZODIAZEPINES (GC/LC/MS), URINE
Alprazolam metabolite (GC/LC/MS), ur confirm: NEGATIVE ng/mL (ref <25)
CLONAZEPAU: NEGATIVE ng/mL (ref <25)
FLURAZEPAMU: NEGATIVE ng/mL (ref <50)
Lorazepam (GC/LC/MS), ur confirm: NEGATIVE ng/mL (ref <50)
Midazolam (GC/LC/MS), ur confirm: NEGATIVE ng/mL (ref <50)
Nordiazepam (GC/LC/MS), ur confirm: 324 ng/mL — AB (ref <50)
Oxazepam (GC/LC/MS), ur confirm: 322 ng/mL — AB (ref <50)
TRIAZOLAMU: NEGATIVE ng/mL (ref <50)
Temazepam (GC/LC/MS), ur confirm: 327 ng/mL — AB (ref <50)

## 2014-10-08 LAB — OXYCODONE, URINE (LC/MS-MS)
Noroxycodone, Ur: NEGATIVE ng/mL (ref <50)
OXYMORPHONE, URINE: 169 ng/mL — AB (ref <50)
Oxycodone, ur: NEGATIVE ng/mL (ref <50)

## 2014-10-09 LAB — PRESCRIPTION MONITORING PROFILE (SOLSTAS)
Amphetamine/Meth: NEGATIVE ng/mL
BARBITURATE SCREEN, URINE: NEGATIVE ng/mL
BUPRENORPHINE, URINE: NEGATIVE ng/mL
CARISOPRODOL, URINE: NEGATIVE ng/mL
COCAINE METABOLITES: NEGATIVE ng/mL
Cannabinoid Scrn, Ur: NEGATIVE ng/mL
Creatinine, Urine: 160.86 mg/dL (ref >20.0)
ECSTASY: NEGATIVE ng/mL
Fentanyl, Ur: NEGATIVE ng/mL
Meperidine, Ur: NEGATIVE ng/mL
Methadone Screen, Urine: NEGATIVE ng/mL
Nitrites, Initial: NEGATIVE ug/mL
PH URINE, INITIAL: 7.6 pH (ref 4.5–8.9)
PROPOXYPHENE: NEGATIVE ng/mL
TAPENTADOLUR: NEGATIVE ng/mL
TRAMADOL UR: NEGATIVE ng/mL
Zolpidem, Urine: NEGATIVE ng/mL

## 2014-10-13 NOTE — Progress Notes (Addendum)
Urine drug screen consistent for low level hydrocodone reported last taken 09/28/14(test 10/01/14).  Low level oxymorphone a metabolite of oxycodone which was given #10 pills of percocet 08/06/14.  Diazepam metabolites present but has been prescribed this medication. Will need to to clarify about dental problem requiring hydrocodone.

## 2014-10-14 ENCOUNTER — Telehealth: Payer: Self-pay | Admitting: *Deleted

## 2014-10-14 NOTE — Telephone Encounter (Signed)
Before I notify her of this I wanted to clarify.   On her UDS collection she reported hydrocodone last taken 09/28/14 because of dental issues and medical record reflects #15 given to her on 09/26/13. She had the low level oxymorphone (metabolite of oxycodone) which she has been prescribed in the recent past.

## 2014-10-14 NOTE — Telephone Encounter (Signed)
You are right, I was looking at her medication history and saw the hydrocodone rx 1/16/ but when looking again it was actually 09/27/2011.  I pulled a NCCSR and last hydrocodone was 04/28/2014 #10 by a Felicie Mornavid Smith [facility]. The percocet was #10 prescribed 08/06/14 by another PA @[facility] 

## 2014-10-14 NOTE — Telephone Encounter (Signed)
Ok we need some documentation of this from DDS

## 2014-10-14 NOTE — Telephone Encounter (Signed)
-----   Message from Erick ColaceAndrew E Kirsteins, MD sent at 10/09/2014  4:36 PM EST ----- Please inform pt of non narcotic options only due to inconsistent UDS, Oxycodone reported but both oxy and hydrocodone detected

## 2014-10-20 NOTE — Telephone Encounter (Signed)
Left vm for Lawson FiscalLori to call office and ask for me.  Her UDS has evidence of taking both hydrocodone and oxycodone and this needs to be clarified.

## 2014-10-29 ENCOUNTER — Other Ambulatory Visit: Payer: Self-pay

## 2014-10-29 DIAGNOSIS — M545 Low back pain: Secondary | ICD-10-CM

## 2014-11-12 ENCOUNTER — Ambulatory Visit: Payer: BLUE CROSS/BLUE SHIELD | Admitting: Physical Medicine & Rehabilitation

## 2014-11-12 ENCOUNTER — Encounter: Payer: BLUE CROSS/BLUE SHIELD | Attending: Physical Medicine & Rehabilitation

## 2014-11-12 DIAGNOSIS — G894 Chronic pain syndrome: Secondary | ICD-10-CM | POA: Insufficient documentation

## 2014-11-12 DIAGNOSIS — M545 Low back pain: Secondary | ICD-10-CM | POA: Insufficient documentation

## 2014-11-12 DIAGNOSIS — G8929 Other chronic pain: Secondary | ICD-10-CM | POA: Insufficient documentation

## 2014-11-12 DIAGNOSIS — M5136 Other intervertebral disc degeneration, lumbar region: Secondary | ICD-10-CM | POA: Insufficient documentation

## 2014-11-12 DIAGNOSIS — M47816 Spondylosis without myelopathy or radiculopathy, lumbar region: Secondary | ICD-10-CM | POA: Insufficient documentation

## 2014-11-12 DIAGNOSIS — Z5181 Encounter for therapeutic drug level monitoring: Secondary | ICD-10-CM | POA: Insufficient documentation

## 2015-02-12 ENCOUNTER — Telehealth: Payer: Self-pay

## 2015-02-12 NOTE — Telephone Encounter (Signed)
[place] [facility] [place] Dr. Johney FrameAjam [facility] [phone]  [facility] Dr[facility] Farrel DemarkAjam's office

## 2015-02-18 ENCOUNTER — Encounter: Payer: Self-pay | Admitting: Physician Assistant

## 2015-02-18 ENCOUNTER — Ambulatory Visit (INDEPENDENT_AMBULATORY_CARE_PROVIDER_SITE_OTHER): Payer: BLUE CROSS/BLUE SHIELD | Admitting: Physician Assistant

## 2015-02-18 VITALS — BP 128/88 | HR 65 | Temp 97.9°F | Ht 67.0 in | Wt 228.0 lb

## 2015-02-18 DIAGNOSIS — R6889 Other general symptoms and signs: Secondary | ICD-10-CM | POA: Diagnosis not present

## 2015-02-18 DIAGNOSIS — R059 Cough, unspecified: Secondary | ICD-10-CM

## 2015-02-18 DIAGNOSIS — R05 Cough: Secondary | ICD-10-CM | POA: Diagnosis not present

## 2015-02-18 DIAGNOSIS — J069 Acute upper respiratory infection, unspecified: Secondary | ICD-10-CM

## 2015-02-18 LAB — POCT INFLUENZA A/B
Influenza A, POC: NEGATIVE
Influenza B, POC: NEGATIVE

## 2015-02-18 MED ORDER — PREDNISONE 10 MG (21) PO TBPK: ORAL_TABLET | ORAL | Status: DC

## 2015-02-18 MED ORDER — CETIRIZINE HCL 10 MG PO TABS: 10.0000 mg | ORAL_TABLET | Freq: Every day | ORAL | Status: DC

## 2015-02-18 MED ORDER — HYDROCODONE-HOMATROPINE 5-1.5 MG/5ML PO SYRP: 5.0000 mL | ORAL_SOLUTION | Freq: Three times a day (TID) | ORAL | Status: DC | PRN

## 2015-02-18 MED ORDER — AZITHROMYCIN 250 MG PO TABS: ORAL_TABLET | ORAL | Status: DC

## 2015-02-18 NOTE — Progress Notes (Signed)
   Subjective:    Patient ID: Pamela May, female    DOB: [birth year], 44 y.o.   MRN: [id number]  HPI 44 y/o female presents with c/o cough, hoarseness, diarrhea, vomiting x 5 days. Has took nyquil with no relief. No recent tick bite or associated sick contact       Review of Systems  Constitutional: Positive for chills, diaphoresis and fatigue. Negative for fever (chest ).  HENT: Positive for congestion and sneezing. Negative for sore throat.   Respiratory: Positive for cough (productive ). Negative for wheezing.   Gastrointestinal: Positive for nausea and diarrhea.  Neurological: Positive for light-headedness and headaches.       Objective:   Physical Exam  Constitutional: She is oriented to person, place, and time. She appears well-developed and well-nourished. No distress.  HENT:  Right Ear: External ear normal.  Left Ear: External ear normal.  Mouth/Throat: Oropharynx is clear and moist.  Posterior pharynx erythema bilaterally   Cardiovascular: Normal rate, regular rhythm, normal heart sounds and intact distal pulses.  Exam reveals no gallop and no friction rub.   No murmur heard. Pulmonary/Chest: Effort normal. She has wheezes (mild expiratory).  Neurological: She is alert and oriented to person, place, and time.  Skin: She is not diaphoretic.  Psychiatric: She has a normal mood and affect. Her behavior is normal. Judgment and thought content normal.  Nursing note and vitals reviewed.         Assessment & Plan:  1. Flu-like symptoms  - POCT Influenza A/B - azithromycin (ZITHROMAX) 250 MG tablet; Take 2 pills on day 1, 1 pill on day 2-5  Dispense: 6 tablet; Refill: 0 - predniSONE (STERAPRED UNI-PAK 21 TAB) 10 MG (21) TBPK tablet; Take as directed  Dispense: 21 tablet; Refill: 0 - cetirizine (ZYRTEC) 10 MG tablet; Take 1 tablet (10 mg total) by mouth daily.  Dispense: 30 tablet; Refill: 11 - HYDROcodone-homatropine (HYCODAN) 5-1.5 MG/5ML syrup; Take 5 mLs by mouth  every 8 (eight) hours as needed for cough.  Dispense: 120 mL; Refill: 0  2. Acute upper respiratory infection  - POCT Influenza A/B - azithromycin (ZITHROMAX) 250 MG tablet; Take 2 pills on day 1, 1 pill on day 2-5  Dispense: 6 tablet; Refill: 0 - predniSONE (STERAPRED UNI-PAK 21 TAB) 10 MG (21) TBPK tablet; Take as directed  Dispense: 21 tablet; Refill: 0 - cetirizine (ZYRTEC) 10 MG tablet; Take 1 tablet (10 mg total) by mouth daily.  Dispense: 30 tablet; Refill: 11 - HYDROcodone-homatropine (HYCODAN) 5-1.5 MG/5ML syrup; Take 5 mLs by mouth every 8 (eight) hours as needed for cough.  Dispense: 120 mL; Refill: 0  3. Cough  - HYDROcodone-homatropine (HYCODAN) 5-1.5 MG/5ML syrup; Take 5 mLs by mouth every 8 (eight) hours as needed for cough.  Dispense: 120 mL; Refill: 0  Discussed with patient allergies to prednisone and hycodan but she states that she can take them. I have advised her to take benadryl immediately if she has and SOB or throat swelling. Stop meds and go to ED immediately   Continue all meds Labs pending Health Maintenance reviewed Diet and exercise encouraged RTO 1 week if no improvement  Tiffany A. Chauncey Reading PA-C

## 2015-03-22 ENCOUNTER — Other Ambulatory Visit: Payer: Self-pay

## 2015-03-22 DIAGNOSIS — G894 Chronic pain syndrome: Secondary | ICD-10-CM

## 2015-06-25 ENCOUNTER — Telehealth: Payer: Self-pay

## 2015-06-25 NOTE — Telephone Encounter (Signed)
Pt given appt with Dr.Bradshaw 10/17 at 8:25.

## 2015-06-25 NOTE — Telephone Encounter (Signed)
Pt is calling concerning her appointment to Pain Management. We have documented where she had not returned calls, so referral was closed. She has now given me a new number for her. She thinks that may have been the problem

## 2015-06-28 ENCOUNTER — Ambulatory Visit (INDEPENDENT_AMBULATORY_CARE_PROVIDER_SITE_OTHER): Payer: BLUE CROSS/BLUE SHIELD | Admitting: Family Medicine

## 2015-06-28 ENCOUNTER — Encounter: Payer: Self-pay | Admitting: Family Medicine

## 2015-06-28 VITALS — BP 137/85 | HR 82 | Temp 98.2°F | Ht 67.0 in | Wt 231.8 lb

## 2015-06-28 DIAGNOSIS — M545 Low back pain: Secondary | ICD-10-CM | POA: Diagnosis not present

## 2015-06-28 DIAGNOSIS — G8929 Other chronic pain: Secondary | ICD-10-CM

## 2015-06-28 NOTE — Patient Instructions (Addendum)
Great to meet you!  Please come back as needed, We will follow up on your pain clinic referral  It looks like you are due for a pap smear and a flu shot

## 2015-06-28 NOTE — Progress Notes (Signed)
   HPI  Patient presents today here for pain clinic referral  She has a Hx of chronic back and neck pain which is stable today. She was treated with TID percocet 5 mg previously but was not able to go for additional testing the previous clinic wanted her too.  Today she describes neck pain, upper back pain, and chronic low back pain.  She declines flu shot, hasnt had a recent pap  Her pain is helped mildly by naproxyn  She see's mental health for psych meds living Valium, trazodone, and Prozac.  PMH: Smoking status noted ROS: Per HPI  Objective: BP 137/85 mmHg  Pulse 82  Temp(Src) 98.2 F (36.8 C) (Oral)  Ht 5\' 7"  (1.702 m)  Wt 231 lb 12.8 oz (105.144 kg)  BMI 36.30 kg/m2 Gen: NAD, alert, cooperative with exam HEENT: NCAT CV: RRR, good S1/S2, no murmur Resp: CTABL, no wheezes, non-labored  Ext: No edema, warm Neuro: Alert and oriented, No gross deficits  Assessment and Plan  Chronic pain. Referral to pain management, her previous request is arty chart which I copied below. Discussed the limitations of chronic narcotics, follow-up with referral   [facility] [place] [facility] [place] [facility] [place]. Johney FrameAjam [facility] [phone]   Orders Placed This Encounter  Procedures  . Ambulatory referral to Pain Clinic    Referral Priority:  Routine    Referral Type:  Consultation    Referral Reason:  Specialty Services Required    Requested Specialty:  Pain Medicine    Number of Visits Requested:  1     Murtis SinkSam Marquasia Schmieder, MD [facility] 06/28/2015, 9:02 AM

## 2016-04-17 ENCOUNTER — Telehealth: Payer: Self-pay

## 2016-04-17 DIAGNOSIS — G8929 Other chronic pain: Secondary | ICD-10-CM

## 2016-04-17 DIAGNOSIS — M47816 Spondylosis without myelopathy or radiculopathy, lumbar region: Secondary | ICD-10-CM

## 2016-04-17 DIAGNOSIS — M5136 Other intervertebral disc degeneration, lumbar region: Secondary | ICD-10-CM

## 2016-04-17 DIAGNOSIS — M545 Low back pain: Secondary | ICD-10-CM

## 2016-04-17 NOTE — Telephone Encounter (Signed)
Na - 8/7-JHB

## 2016-04-17 NOTE — Telephone Encounter (Signed)
Ok with referral.   Murtis SinkSam Kyasia Steuck, MD [facility] 04/17/2016, 1:29 PM

## 2016-10-04 ENCOUNTER — Telehealth: Payer: Self-pay | Admitting: Family Medicine

## 2016-10-10 ENCOUNTER — Telehealth: Payer: Self-pay

## 2016-10-10 NOTE — Telephone Encounter (Signed)
Tried to contact patient for need for appointment for MRI and the number is not in service.

## 2016-10-11 NOTE — Telephone Encounter (Signed)
Spoke with pt and told her I would call again to [facility] [phone] Called and left message with Ryan-referral coordinator

## 2016-10-16 ENCOUNTER — Telehealth: Payer: Self-pay | Admitting: Family Medicine

## 2016-10-16 DIAGNOSIS — R9082 White matter disease, unspecified: Secondary | ICD-10-CM | POA: Insufficient documentation

## 2016-10-16 NOTE — Telephone Encounter (Signed)
MR ordered per request for neuro appt.   Murtis SinkSam Bradshaw, MD [facility] 10/16/2016, 3:04 PM

## 2016-10-17 NOTE — Telephone Encounter (Signed)
[facility]. Office notes faxed and order placed for MRI. Ryan to contact t to let her know we are working on this.

## 2016-10-23 ENCOUNTER — Ambulatory Visit (HOSPITAL_COMMUNITY)
Admission: RE | Admit: 2016-10-23 | Discharge: 2016-10-23 | Disposition: A | Discharge status: Home / Self Care | Payer: BLUE CROSS/BLUE SHIELD | Source: Ambulatory Visit | Attending: Family Medicine | Admitting: Family Medicine

## 2016-10-23 DIAGNOSIS — R930 Abnormal findings on diagnostic imaging of skull and head, not elsewhere classified: Secondary | ICD-10-CM | POA: Insufficient documentation

## 2016-10-23 DIAGNOSIS — R9082 White matter disease, unspecified: Secondary | ICD-10-CM

## 2016-10-31 ENCOUNTER — Ambulatory Visit (INDEPENDENT_AMBULATORY_CARE_PROVIDER_SITE_OTHER): Payer: BLUE CROSS/BLUE SHIELD | Admitting: Neurology

## 2016-10-31 ENCOUNTER — Encounter: Payer: Self-pay | Admitting: Neurology

## 2016-10-31 VITALS — BP 116/62 | HR 96 | Ht 66.0 in | Wt 218.6 lb

## 2016-10-31 DIAGNOSIS — G894 Chronic pain syndrome: Secondary | ICD-10-CM | POA: Diagnosis not present

## 2016-10-31 NOTE — Progress Notes (Signed)
NEUROLOGY CONSULTATION NOTE  Pamela May MRN: [id number] DOB: [birth year]  Referring provider: Dr. Vear Clock Primary care provider: Dr. Ermalinda Memos  Reason for consult:  Evaluate for multiple sclerosis.  HISTORY OF PRESENT ILLNESS: Pamela May is a 46 year old female with history of chronic pain syndrome (neck and back pain), anxiety, depression and migraines who presents for evaluation of multiple sclerosis.  History supplemented by pain specialist note and neurologist note from [facility].  She has longstanding history of chronic pain, including still joints, myalgias, hyperesthesia and dysesthesia on her skin.  She reports constant numbness and tingling as well as severe burning pain in all extremities.  She says it feels like her "muscles are ripping".  Pain involves the back and into the buttocks.  The pain is so severe, she had difficulty ambulating.  She does not have weakness.  She has baseline poor vision, particularly in the right eye, but no episodes of vision loss.  Cold makes the pain worse.  A hot shower is soothing.  MRI of lumbar spine from 07/21/14 showed mild disc protrusions at L2-3, L4-5 and L5-S1.  She was treated by a neurologist in 2004 for migraines.  At that time, she had an MRI of the brain that reportedly revealed cerebral white matter abnormalities that was thought to be due to migraine or possibly demyelinating disease.  She had a repeat MRI of the brain without contrast on 10/23/16, which was personally reviewed and was unremarkable.  It was a limited study due to motion artifact but there was no evidence of demyelinating disease or significant white matter abnormalities.    She was evaluated by neurology at [facility] last year.  NCV-EMG was performed in March, which was normal.  She was later evaluated by rheumatology.  Labs from 03/31/16 include  HGB A1c 5.3, TSH 1.780, Sed Rate 19.  B12 was 230 and she was started on daily.  Vitamin D 25-hydroxy was low at  18 and was started on supplementation.  It was suggested that she may have fibromyalgia.  She takes topiramate 100mg  twice daily, diazepam, fluoxetine 20mg  daily and trazodone at bedtime.  She also takes B12 supplements.  She was previously taking vitamin D.  She has had side effects to gabapentin, Lyrica, meloxicam, naproxen, Toradol, and Ultram.  She is allergic to Sulfa drugs.  PAST MEDICAL HISTORY: Past Medical History:  Diagnosis Date  . Anxiety   . Back injury   . Chronic back pain   . Chronic dental pain   . Depression   . Migraine headache     PAST SURGICAL HISTORY: Past Surgical History:  Procedure Laterality Date  . CHOLECYSTECTOMY    . EYE SURGERY    . TUBAL LIGATION      MEDICATIONS: Current Outpatient Prescriptions on File Prior to Visit  Medication Sig Dispense Refill  . diazepam (VALIUM) 10 MG tablet Take 10 mg by mouth 4 (four) times daily.    Marland Kitchen FLUoxetine (PROZAC) 20 MG capsule Take 20 mg by mouth daily.    . traZODone (DESYREL) 100 MG tablet Take 100 mg by mouth at bedtime.     No current facility-administered medications on file prior to visit.     ALLERGIES: Allergies  Allergen Reactions  . Cortisone Base Hives, Itching and Swelling    "some type of pills" Patient is unaware if it was actually the steroid that caused reaction   . Gabapentin Other (See Comments)    Vomiting and hives  .  Ketorolac Tromethamine Hives, Itching and Swelling  . Lyrica [Pregabalin]   . Sulfa Antibiotics Hives, Itching and Swelling  . Ultram [Tramadol Hcl] Hives, Itching and Swelling  . Ibuprofen Rash  . Meloxicam Rash  . Naproxen Rash    FAMILY HISTORY: Family History  Problem Relation Age of Onset  . Diabetes Other   . Heart failure Other     SOCIAL HISTORY: Social History   Social History  . Marital status: Single    Spouse name: N/A  . Number of children: N/A  . Years of education: N/A   Occupational History  . Not on file.   Social History Main  Topics  . Smoking status: Never Smoker  . Smokeless tobacco: Never Used  . Alcohol use No  . Drug use: No  . Sexual activity: Yes    Birth control/ protection: Surgical   Other Topics Concern  . Not on file   Social History Narrative  . No narrative on file    REVIEW OF SYSTEMS: Constitutional: No fevers, chills, or sweats, no generalized fatigue, change in appetite Eyes: No visual changes, double vision, eye pain Ear, nose and throat: No hearing loss, ear pain, nasal congestion, sore throat Cardiovascular: No chest pain, palpitations Respiratory:  No shortness of breath at rest or with exertion, wheezes GastrointestinaI: No nausea, vomiting, diarrhea, abdominal pain, fecal incontinence Genitourinary:  No dysuria, urinary retention or frequency Musculoskeletal:  No neck pain, back pain Integumentary: No rash, pruritus, skin lesions Neurological: as above Psychiatric: No depression, insomnia, anxiety Endocrine: No palpitations, fatigue, diaphoresis, mood swings, change in appetite, change in weight, increased thirst Hematologic/Lymphatic:  No purpura, petechiae. Allergic/Immunologic: no itchy/runny eyes, nasal congestion, recent allergic reactions, rashes  PHYSICAL EXAM: Vitals:   10/31/16 1454  BP: 116/62  Pulse: 96   General: In pain.  Patient appears well-groomed.  Head:  Normocephalic/atraumatic Eyes:  fundi examined but not visualized Neck: supple, no paraspinal tenderness, full range of motion Back: No paraspinal tenderness Heart: regular rate and rhythm Lungs: Clear to auscultation bilaterally. Vascular: No carotid bruits. Neurological Exam: Mental status: alert and oriented to person, place, and time, recent and remote memory intact, fund of knowledge intact, attention and concentration intact, speech fluent and not dysarthric, language intact. Cranial nerves: CN I: not tested CN II: pupils equal, round and reactive to light, visual fields intact CN III, IV,  VI:  full range of motion, no nystagmus, no ptosis CN V: facial sensation intact CN VII: upper and lower face symmetric CN VIII: hearing intact CN IX, X: gag intact, uvula midline CN XI: sternocleidomastoid and trapezius muscles intact CN XII: tongue midline Bulk & Tone: normal, no fasciculations. Motor:  Poor effort due to pain but grossly intact throughout  Sensation:  Pinprick sensation intact and vibration sensation reduced in feet. Deep Tendon Reflexes:  2+ throughout, toes downgoing.  Finger to nose testing:  Without dysmetria.  Heel to shin:  Without dysmetria.  Gait:  Antalgic gait. Romberg negative.  IMPRESSION: Chronic pain syndrome.  I have no identifiable neurologic explanation for her symptoms.  She does not have multiple sclerosis.  Even though the MRI was a poor study, demyelinating lesions consistent with MS would still be detectable.  She also does not demonstrate a peripheral neuropathy.  She exhibits large fiber nerve dysfunction on exam, which should be identified on NCV-EMG. PLAN: 1.  Continue pain management as per specialist 2.   Continue B12.  PCP may want to consider repeating level. 3.  No further workup recommended.  Thank you for allowing me to take part in the care of this patient.  Shon Millet, DO  CC:  Thyra Breed, MD  Kevin Fenton, MD

## 2016-10-31 NOTE — Patient Instructions (Signed)
I don't see any specific neurologic condition that would be causing the pain.  The MRI of the brain does not show multiple sclerosis.  You are already taking B12, but your PCP may want to recheck a level.  I will look [facility].  If it is abnormal, I will contact you.  Otherwise, there are no further testing recommended.

## 2017-06-27 NOTE — Congregational Nurse Program (Signed)
Congregational Nurse Program Note  Date of Encounter: 06/21/2017  Past Medical History: Past Medical History:  Diagnosis Date  . Anxiety   . Back injury   . Chronic back pain   . Chronic dental pain   . Depression   . Migraine headache     Encounter Details:     CNP Questionnaire - 06/21/17 1200      Patient Demographics   Is this a new or existing patient? New   Patient is considered a/an Not Applicable   Race Caucasian/White     Patient Assistance   Location of Patient Assistance [place]   Patient's financial/insurance status Self-Pay (Uninsured)   Uninsured Patient ([place] [employer] Connects) No   Patient referred to apply for the following financial assistance Not Applicable   Food insecurities addressed Not Applicable   Transportation assistance No   Assistance securing medications No   Educational Programmer, systemshealth offerings Navigating the healthcare system     Encounter Details   Primary purpose of visit Navigating the Healthcare System;Chronic Illness/Condition Visit   Was an Emergency Department visit averted? Not Applicable   Does patient have a medical provider? No   Patient referred to Establish PCP   Was a mental health screening completed? (GAINS tool) No   Does patient have dental issues? No   Does patient have vision issues? No   Does your patient have an abnormal blood pressure today? No   Since previous encounter, have you referred patient for abnormal blood pressure that resulted in a new diagnosis or medication change? No   Does your patient have an abnormal blood glucose today? No   Since previous encounter, have you referred patient for abnormal blood glucose that resulted in a new diagnosis or medication change? No   Was there a life-saving intervention made? No     Client was seen today to establish a PCP.  Client has multiple medical issues and on medications. Assisted client with an appointment through [facility] [place].  Telephone to set up appointment, information given to [facility] ([facility]) to follow up with client.  Client was given information and encouraged to call if any questions or concerns.  Pearletha AlfredJan Lakeva Hollon,RN  [phone].

## 2019-12-12 ENCOUNTER — Ambulatory Visit: Payer: Medicaid Other | Attending: Internal Medicine

## 2019-12-12 ENCOUNTER — Other Ambulatory Visit: Payer: Self-pay | Admitting: Nurse Practitioner

## 2019-12-12 DIAGNOSIS — Z1231 Encounter for screening mammogram for malignant neoplasm of breast: Secondary | ICD-10-CM

## 2019-12-12 DIAGNOSIS — Z23 Encounter for immunization: Secondary | ICD-10-CM

## 2019-12-12 NOTE — Progress Notes (Signed)
   Covid-19 Vaccination Clinic  Name:  PERLITA FORBUSH    MRN: [id number] DOB: [birth year]  12/12/2019  Ms. Gade was observed post Covid-19 immunization for 15 minutes without incident. She was provided with Vaccine Information Sheet and instruction to access the V-Safe system.   Ms. Menendez was instructed to call 911 with any severe reactions post vaccine: Marland Kitchen Difficulty breathing  . Swelling of face and throat  . A fast heartbeat  . A bad rash all over body  . Dizziness and weakness   Immunizations Administered    Name Date Dose VIS Date Route   Moderna COVID-19 Vaccine 12/12/2019  8:49 AM 0.5 mL 08/12/2019 Intramuscular   Manufacturer: Moderna   Lot: [id number]   NDC: 23702-301-72

## 2019-12-30 ENCOUNTER — Ambulatory Visit: Payer: Medicaid Other

## 2020-01-02 ENCOUNTER — Other Ambulatory Visit: Payer: Self-pay

## 2020-01-02 ENCOUNTER — Ambulatory Visit
Admission: RE | Admit: 2020-01-02 | Discharge: 2020-01-02 | Disposition: A | Discharge status: Home / Self Care | Payer: Medicaid Other | Source: Ambulatory Visit | Attending: Nurse Practitioner | Admitting: Nurse Practitioner

## 2020-01-02 DIAGNOSIS — Z1231 Encounter for screening mammogram for malignant neoplasm of breast: Secondary | ICD-10-CM

## 2020-01-14 ENCOUNTER — Ambulatory Visit: Payer: Medicaid Other | Attending: Internal Medicine

## 2020-01-14 DIAGNOSIS — Z23 Encounter for immunization: Secondary | ICD-10-CM

## 2020-01-14 NOTE — Progress Notes (Signed)
   Covid-19 Vaccination Clinic  Name:  Pamela May    MRN: [id number] DOB: 1970-12-22  01/14/2020  [name] was observed post Covid-19 immunization for 15 minutes without incident. She was provided with Vaccine Information Sheet and instruction to access the V-Safe system.   [name] was instructed to call 911 with any severe reactions post vaccine: Marland Kitchen Difficulty breathing  . Swelling of face and throat  . A fast heartbeat  . A bad rash all over body  . Dizziness and weakness   Immunizations Administered    Name Date Dose VIS Date Route   Moderna COVID-19 Vaccine 01/14/2020  7:55 AM 0.5 mL 08/2019 Intramuscular   Manufacturer: Moderna   Lot: [id number]   NDC: 86761-950-93

## 2020-05-25 IMAGING — MG DIGITAL SCREENING BILAT W/ TOMO W/ CAD
8 series · 8 of 24 positions shown · non-contrast
Comparison: None.

CLINICAL DATA: Screening.

EXAM:
DIGITAL SCREENING BILATERAL MAMMOGRAM WITH TOMO AND CAD

[L MLO synth-2D]
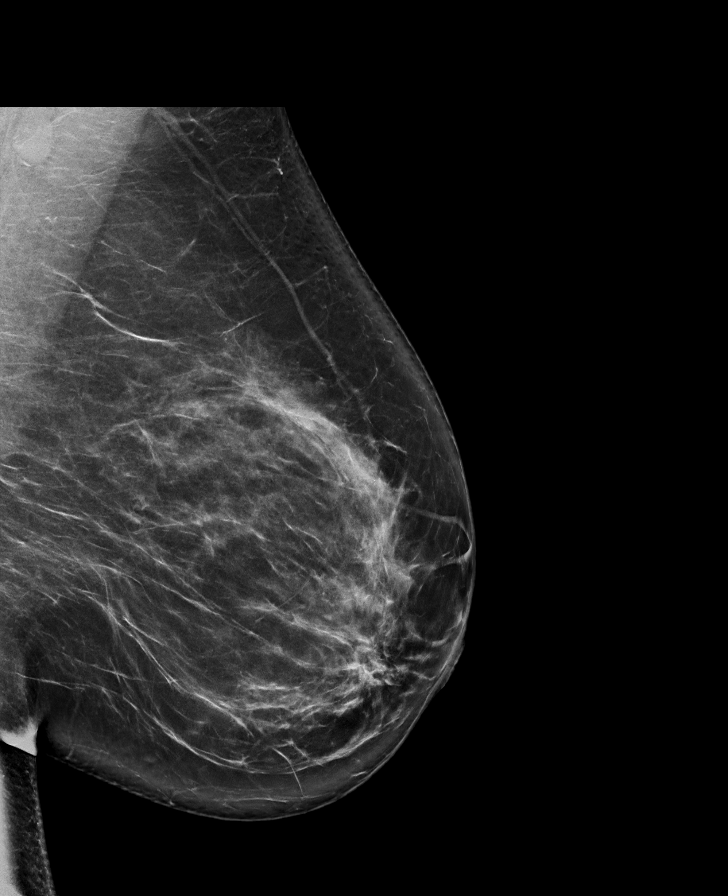

[R CC synth-2D]
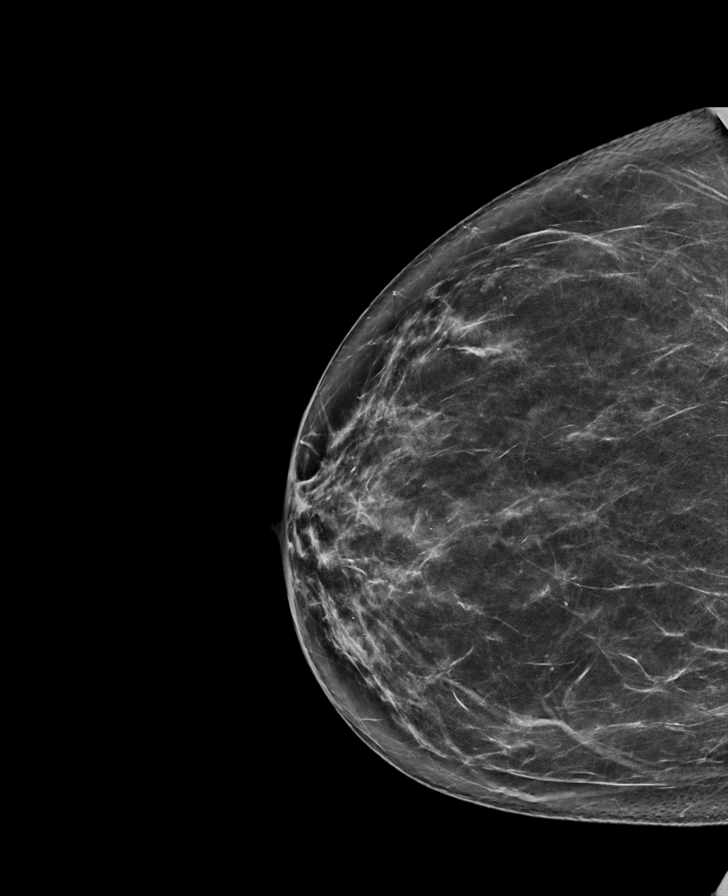

[R MLO synth-2D]
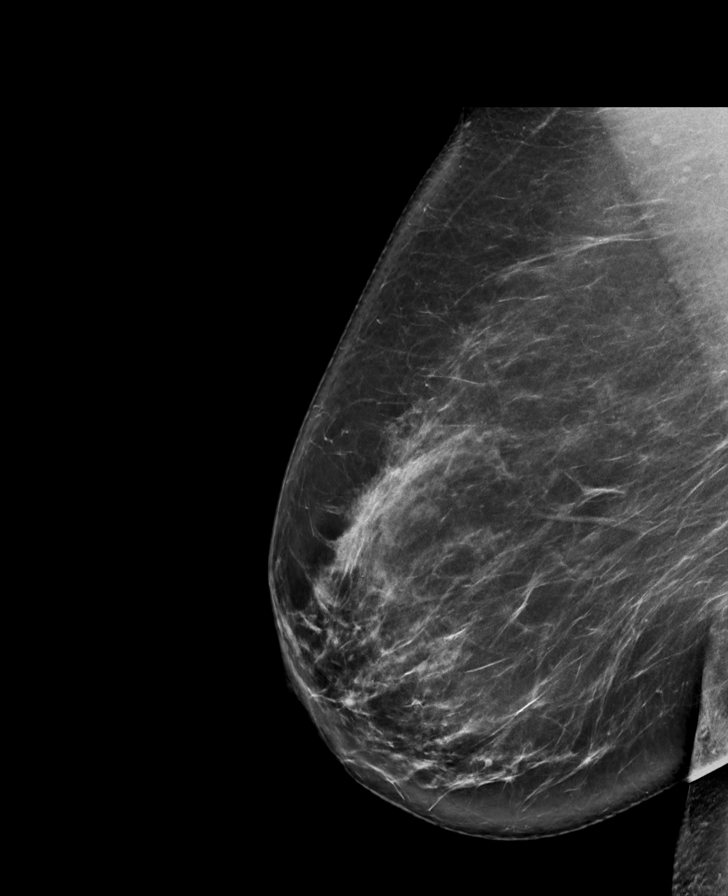

[L CC synth-2D]
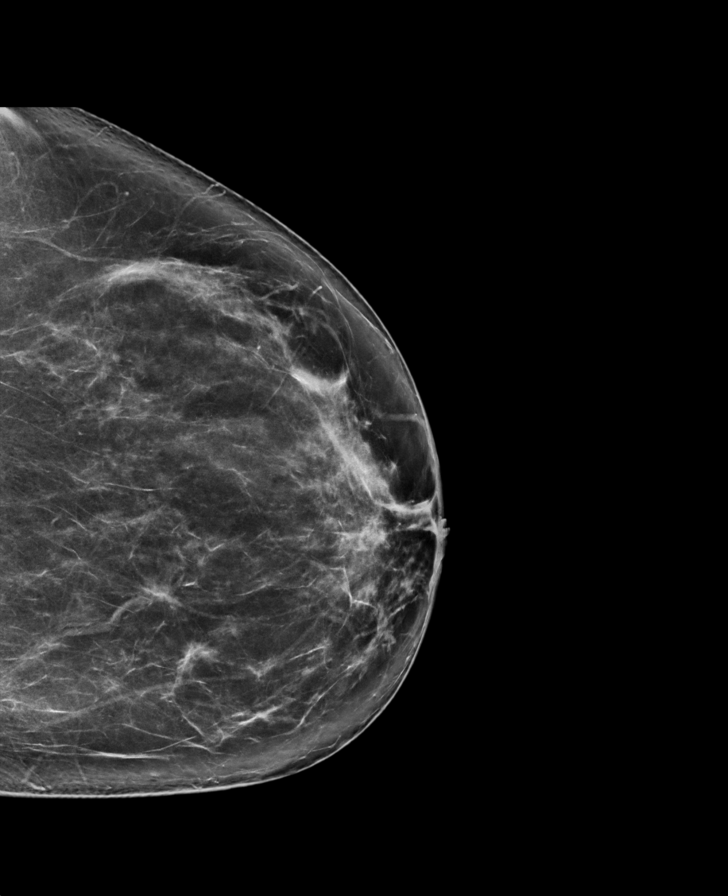

[R CC tomo · tomo slice 40/79.0]
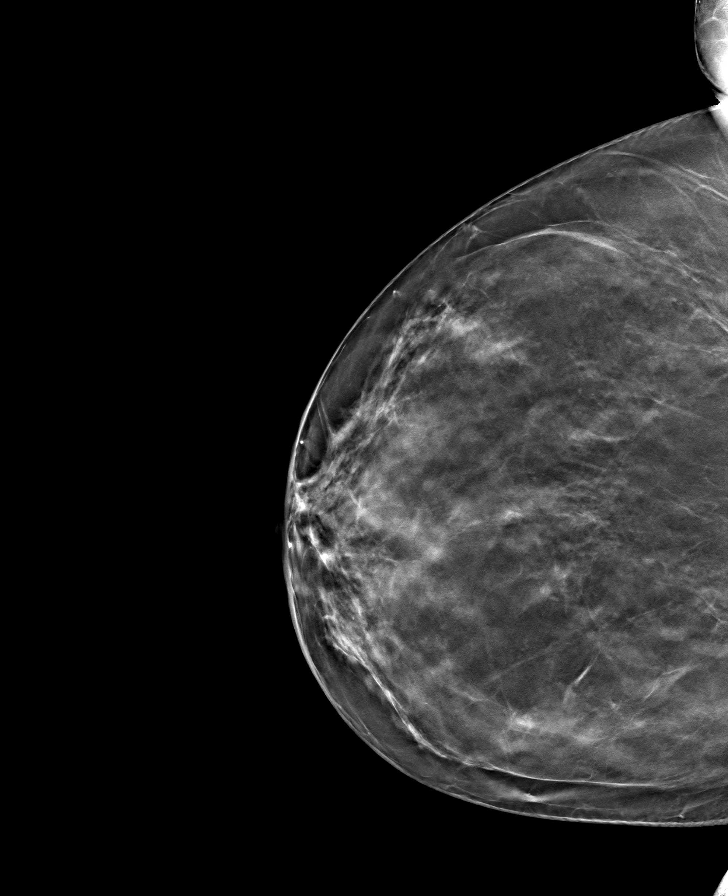

[L CC tomo · tomo slice 43/86.0]
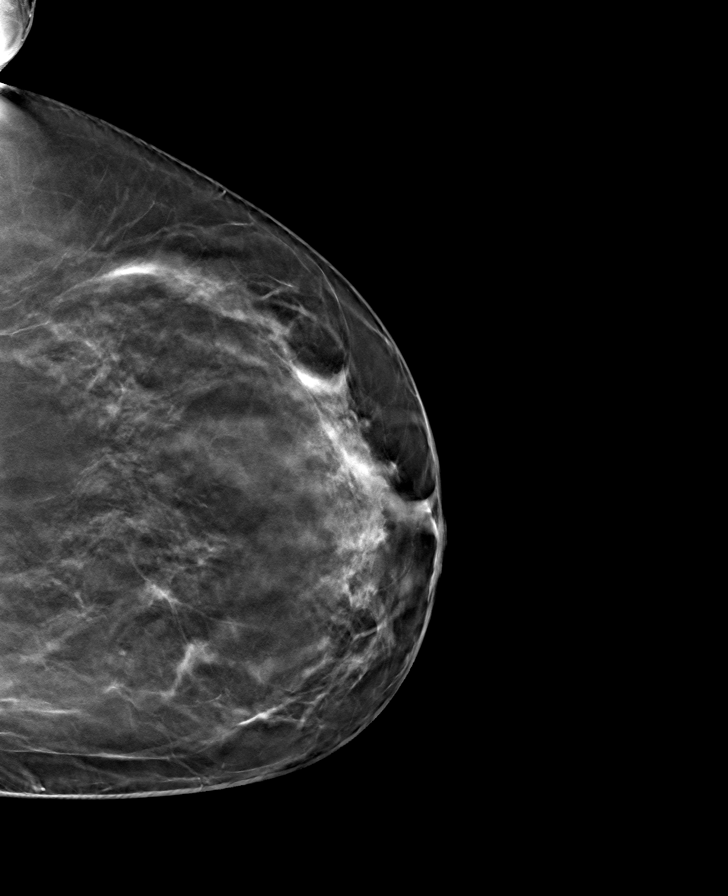

[L MLO tomo · tomo slice 48/95.0]
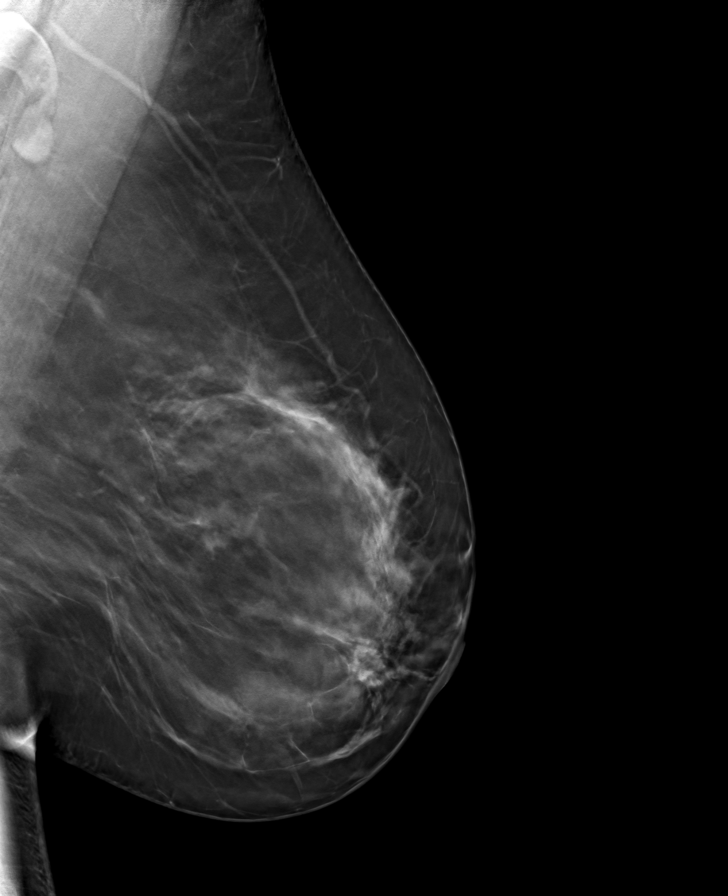

[R MLO tomo · tomo slice 49/97.0]
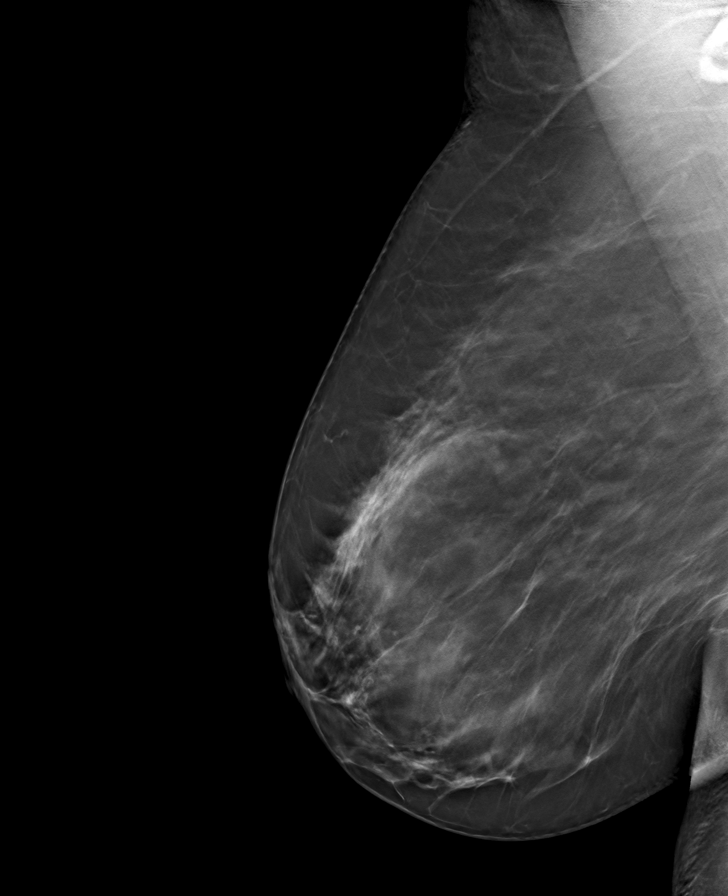

[8 of 24 positions shown; findings below may reference images not displayed]

ACR Breast Density Category b: There are scattered areas of
fibroglandular density.
FINDINGS: There are no findings suspicious for malignancy. Images were
processed with CAD.
IMPRESSION: No mammographic evidence of malignancy. A result letter of this
screening mammogram will be mailed directly to the patient.

RECOMMENDATION:
Screening mammogram in one year. (Code:[id number])

BI-RADS CATEGORY  1: Negative.

## 2021-06-28 ENCOUNTER — Other Ambulatory Visit: Payer: Self-pay | Admitting: Family Medicine

## 2021-06-28 ENCOUNTER — Other Ambulatory Visit: Payer: Self-pay | Admitting: Nurse Practitioner

## 2021-06-28 DIAGNOSIS — Z1231 Encounter for screening mammogram for malignant neoplasm of breast: Secondary | ICD-10-CM

## 2021-07-01 ENCOUNTER — Ambulatory Visit
Admission: RE | Admit: 2021-07-01 | Discharge: 2021-07-01 | Disposition: A | Discharge status: Home / Self Care | Payer: Medicaid Other | Source: Ambulatory Visit | Attending: Nurse Practitioner | Admitting: Nurse Practitioner

## 2021-07-01 ENCOUNTER — Other Ambulatory Visit: Payer: Self-pay

## 2021-07-01 DIAGNOSIS — Z1231 Encounter for screening mammogram for malignant neoplasm of breast: Secondary | ICD-10-CM

## 2022-07-18 ENCOUNTER — Other Ambulatory Visit: Payer: Self-pay | Admitting: Nurse Practitioner

## 2022-07-18 DIAGNOSIS — Z1231 Encounter for screening mammogram for malignant neoplasm of breast: Secondary | ICD-10-CM

## 2022-09-15 ENCOUNTER — Ambulatory Visit: Payer: Medicaid Other

## 2022-10-04 ENCOUNTER — Ambulatory Visit (HOSPITAL_COMMUNITY)
Admission: RE | Admit: 2022-10-04 | Discharge: 2022-10-04 | Disposition: A | Discharge status: Home / Self Care | Payer: Medicaid Other | Source: Ambulatory Visit | Attending: Nurse Practitioner | Admitting: Nurse Practitioner

## 2022-10-04 ENCOUNTER — Encounter (HOSPITAL_COMMUNITY): Payer: Self-pay | Admitting: Radiology

## 2022-10-04 DIAGNOSIS — Z1231 Encounter for screening mammogram for malignant neoplasm of breast: Secondary | ICD-10-CM | POA: Diagnosis present

## 2022-10-10 ENCOUNTER — Other Ambulatory Visit (HOSPITAL_COMMUNITY): Payer: Self-pay | Admitting: Nurse Practitioner

## 2022-10-10 DIAGNOSIS — R928 Other abnormal and inconclusive findings on diagnostic imaging of breast: Secondary | ICD-10-CM

## 2024-02-21 ENCOUNTER — Other Ambulatory Visit (HOSPITAL_COMMUNITY): Payer: Self-pay | Admitting: Nurse Practitioner

## 2024-02-21 DIAGNOSIS — Z780 Asymptomatic menopausal state: Secondary | ICD-10-CM

## 2024-03-03 ENCOUNTER — Other Ambulatory Visit (HOSPITAL_COMMUNITY)

## 2024-03-05 ENCOUNTER — Other Ambulatory Visit (HOSPITAL_COMMUNITY)
